# Patient Record
Sex: Female | Born: 1950 | Race: Black or African American | Hispanic: No | Marital: Married | Smoking: Never smoker
Health system: Southern US, Community
[De-identification: ages and names within clinical notes are randomized; demographics above are authoritative.]

## PROBLEM LIST (undated history)

## (undated) DIAGNOSIS — I10 Essential (primary) hypertension: Secondary | ICD-10-CM

## (undated) DIAGNOSIS — E119 Type 2 diabetes mellitus without complications: Secondary | ICD-10-CM

## (undated) HISTORY — PX: BACK SURGERY: SHX140

---

## 2005-02-13 ENCOUNTER — Ambulatory Visit: Payer: Self-pay | Admitting: Internal Medicine

## 2005-02-20 ENCOUNTER — Ambulatory Visit: Payer: Self-pay | Admitting: Internal Medicine

## 2005-04-25 ENCOUNTER — Ambulatory Visit: Payer: Self-pay | Admitting: Gastroenterology

## 2005-05-10 ENCOUNTER — Ambulatory Visit: Payer: Self-pay | Admitting: Gastroenterology

## 2007-04-28 ENCOUNTER — Ambulatory Visit: Payer: Self-pay | Admitting: Internal Medicine

## 2008-05-03 ENCOUNTER — Ambulatory Visit: Payer: Self-pay | Admitting: Internal Medicine

## 2008-09-20 ENCOUNTER — Ambulatory Visit: Payer: Self-pay | Admitting: Internal Medicine

## 2008-10-15 ENCOUNTER — Emergency Department: Payer: Self-pay | Admitting: Emergency Medicine

## 2010-03-15 ENCOUNTER — Ambulatory Visit: Payer: Self-pay | Admitting: Internal Medicine

## 2011-10-09 ENCOUNTER — Ambulatory Visit: Payer: Self-pay | Admitting: Internal Medicine

## 2013-03-10 ENCOUNTER — Emergency Department: Payer: Self-pay | Admitting: Emergency Medicine

## 2013-03-10 LAB — CBC
HCT: 40.8 % (ref 35.0–47.0)
HGB: 14.1 g/dL (ref 12.0–16.0)
MCH: 32 pg (ref 26.0–34.0)
MCHC: 34.5 g/dL (ref 32.0–36.0)
MCV: 93 fL (ref 80–100)
Platelet: 224 10*3/uL (ref 150–440)
RDW: 12.3 % (ref 11.5–14.5)

## 2013-03-10 LAB — BASIC METABOLIC PANEL
Anion Gap: 10 (ref 7–16)
Chloride: 101 mmol/L (ref 98–107)
Creatinine: 0.88 mg/dL (ref 0.60–1.30)
EGFR (Non-African Amer.): >60
Sodium: 135 mmol/L — ABNORMAL LOW (ref 136–145)

## 2013-04-08 ENCOUNTER — Ambulatory Visit: Payer: Self-pay | Admitting: Internal Medicine

## 2013-11-20 DIAGNOSIS — E785 Hyperlipidemia, unspecified: Secondary | ICD-10-CM | POA: Diagnosis present

## 2013-11-20 DIAGNOSIS — E1169 Type 2 diabetes mellitus with other specified complication: Secondary | ICD-10-CM | POA: Diagnosis present

## 2013-11-20 DIAGNOSIS — M810 Age-related osteoporosis without current pathological fracture: Secondary | ICD-10-CM | POA: Insufficient documentation

## 2015-09-27 ENCOUNTER — Encounter: Payer: Self-pay | Admitting: Emergency Medicine

## 2015-09-27 ENCOUNTER — Emergency Department
Admission: EM | Admit: 2015-09-27 | Discharge: 2015-09-27 | Disposition: A | Discharge status: Home / Self Care | Payer: BC Managed Care – PPO | Attending: Emergency Medicine | Admitting: Emergency Medicine

## 2015-09-27 ENCOUNTER — Emergency Department: Payer: BC Managed Care – PPO

## 2015-09-27 DIAGNOSIS — I10 Essential (primary) hypertension: Secondary | ICD-10-CM | POA: Diagnosis not present

## 2015-09-27 DIAGNOSIS — N390 Urinary tract infection, site not specified: Secondary | ICD-10-CM | POA: Insufficient documentation

## 2015-09-27 DIAGNOSIS — E119 Type 2 diabetes mellitus without complications: Secondary | ICD-10-CM | POA: Diagnosis not present

## 2015-09-27 DIAGNOSIS — R300 Dysuria: Secondary | ICD-10-CM | POA: Diagnosis present

## 2015-09-27 HISTORY — DX: Essential (primary) hypertension: I10

## 2015-09-27 HISTORY — DX: Type 2 diabetes mellitus without complications: E11.9

## 2015-09-27 LAB — URINALYSIS COMPLETE WITH MICROSCOPIC (ARMC ONLY)
BILIRUBIN URINE: NEGATIVE
NITRITE: NEGATIVE
Protein, ur: 30 mg/dL — AB
Specific Gravity, Urine: 1.023 (ref 1.005–1.030)
pH: 5 (ref 5.0–8.0)

## 2015-09-27 LAB — BASIC METABOLIC PANEL
ANION GAP: 11 (ref 5–15)
BUN: 8 mg/dL (ref 6–20)
CHLORIDE: 101 mmol/L (ref 101–111)
CO2: 24 mmol/L (ref 22–32)
Calcium: 9.6 mg/dL (ref 8.9–10.3)
Creatinine, Ser: 0.89 mg/dL (ref 0.44–1.00)
GFR calc Af Amer: >60 mL/min (ref >60)
GLUCOSE: 271 mg/dL — AB (ref 65–99)
POTASSIUM: 3.9 mmol/L (ref 3.5–5.1)
Sodium: 136 mmol/L (ref 135–145)

## 2015-09-27 LAB — CBC
HEMATOCRIT: 40.7 % (ref 35.0–47.0)
HEMOGLOBIN: 13.6 g/dL (ref 12.0–16.0)
MCH: 31.1 pg (ref 26.0–34.0)
MCHC: 33.5 g/dL (ref 32.0–36.0)
MCV: 92.9 fL (ref 80.0–100.0)
Platelets: 205 10*3/uL (ref 150–440)
RBC: 4.38 MIL/uL (ref 3.80–5.20)
RDW: 12.7 % (ref 11.5–14.5)
WBC: 11.8 10*3/uL — ABNORMAL HIGH (ref 3.6–11.0)

## 2015-09-27 LAB — GLUCOSE, CAPILLARY: GLUCOSE-CAPILLARY: 271 mg/dL — AB (ref 65–99)

## 2015-09-27 MED ORDER — ACETAMINOPHEN 325 MG PO TABS
650.0000 mg | ORAL_TABLET | Freq: Once | ORAL | Status: AC | PRN
  Administered 2015-09-27: 650 mg via ORAL
  Filled 2015-09-27: qty 2

## 2015-09-27 MED ORDER — SODIUM CHLORIDE 0.9 % IV BOLUS (SEPSIS)
1000.0000 mL | Freq: Once | INTRAVENOUS | Status: AC
  Administered 2015-09-27: 1000 mL via INTRAVENOUS

## 2015-09-27 MED ORDER — DEXTROSE 5 % IV SOLN: 1.0000 g | Freq: Once | INTRAVENOUS | Status: DC

## 2015-09-27 MED ORDER — DEXTROSE 5 % IV SOLN
1.0000 g | Freq: Once | INTRAVENOUS | Status: AC
  Administered 2015-09-27: 1 g via INTRAVENOUS
  Filled 2015-09-27: qty 10

## 2015-09-27 MED ORDER — CEPHALEXIN 500 MG PO CAPS: 500.0000 mg | ORAL_CAPSULE | Freq: Two times a day (BID) | ORAL | Status: DC

## 2015-09-27 NOTE — ED Notes (Signed)
Lab results reviewed. Patient observed in subwait in no acute distress.

## 2015-09-27 NOTE — ED Notes (Signed)
Patient presents to the ED from PCP.  Patient states her PCP instructed her to come to the ED because she was dehydrated and needs fluids.  Patient was also informed that she had a UTI.  Patient states she has diabetes and her doctor told her that her blood sugar was high.  Patient states she stopped taking her medication for her diabetes and checking her blood sugar about 1 year ago.

## 2015-09-27 NOTE — ED Provider Notes (Signed)
[facility] Emergency Department Provider Note   ____________________________________________  Time seen:  I have reviewed the triage vital signs and the triage nursing note.  HISTORY  Chief Complaint Fever; Fatigue; and Dysuria   Historian Patient  HPI Ashley Ellison is a 65 y.o. female is here for evaluation for possible dehydration. She states that she's felt bad for a few days and went to her primary care physician and was told she may be dehydrated and need fluids. Patient states that she thought she's had a fever. She's also had some mild vomiting and diarrhea. No significant pain. Mild dysuria, similar when she had a prior UTI diagnosed.  Denies chest pain or shortness of breath or cough.      Past Medical History  Diagnosis Date  . Diabetes mellitus without complication (HCC)   . Hypertension     There are no active problems to display for this patient.   Past Surgical History  Procedure Laterality Date  . Back surgery    . Vaginal delivery      Current Outpatient Rx  Name  Route  Sig  Dispense  Refill  . cephALEXin (KEFLEX) 500 MG capsule   Oral   Take 1 capsule (500 mg total) by mouth 2 (two) times daily.   14 capsule   0     Allergies Review of patient's allergies indicates no known allergies.  No family history on file.  Social History Social History  Substance Use Topics  . Smoking status: Never Smoker   . Smokeless tobacco: None  . Alcohol Use: No    Review of Systems  Constitutional: Positive for fever. Eyes: Negative for visual changes. ENT: Negative for sore throat. Cardiovascular: Negative for chest pain. Respiratory: Negative for shortness of breath. Gastrointestinal: Positive for mild vomiting and diarrhea. Genitourinary: Positive for dysuria. Musculoskeletal: Negative for back pain. Skin: Negative for rash. Neurological: Negative for headache. 10 point Review of Systems otherwise  negative ____________________________________________   PHYSICAL EXAM:  VITAL SIGNS: ED Triage Vitals  Enc Vitals Group     BP 09/27/15 1638 177/71 mmHg     Pulse Rate 09/27/15 1638 106     Resp 09/27/15 1638 20     Temp 09/27/15 1638 101.6 F (38.7 C)     Temp Source 09/27/15 1638 Oral     SpO2 09/27/15 1638 100 %     Weight 09/27/15 1638 180 lb (81.647 kg)     Height 09/27/15 1638  (1.702 m)     Head Cir --      Peak Flow --      Pain Score 09/27/15 1639 7     Pain Loc --      Pain Edu? --      Excl. in GC? --      Constitutional: Alert and oriented. Well appearing and in no distress. HEENT   Head: Normocephalic and atraumatic.      Eyes: Conjunctivae are normal. PERRL. Normal extraocular movements.      Ears:         Nose: No congestion/rhinnorhea.   Mouth/Throat: Mucous membranes are moist.   Neck: No stridor. Cardiovascular/Chest: Normal rate, regular rhythm.  No murmurs, rubs, or gallops. Respiratory: Normal respiratory effort without tachypnea nor retractions. Breath sounds are clear and equal bilaterally. No wheezes/rales/rhonchi. Gastrointestinal: Soft. No distention, no guarding, no rebound. Nontender.    Genitourinary/rectal:Deferred Musculoskeletal: Nontender with normal range of motion in all extremities. No joint effusions.  No lower extremity  tenderness.  No edema. Neurologic:  Normal speech and language. No gross or focal neurologic deficits are appreciated. Skin:  Skin is warm, dry and intact. No rash noted. Psychiatric: Mood and affect are normal. Speech and behavior are normal. Patient exhibits appropriate insight and judgment.  ____________________________________________   EKG I, Governor Rooksebecca Mekisha Bittel, MD, the attending physician have personally viewed and interpreted all ECGs.  104 sinus tachycardia. Narrow QRS. Normal axis. Normal ST and T-wave ____________________________________________  LABS (pertinent positives/negatives)  Labs  Reviewed  BASIC METABOLIC PANEL - Abnormal; Notable for the following:    Glucose, Bld 271 (*)    All other components within normal limits  CBC - Abnormal; Notable for the following:    WBC 11.8 (*)    All other components within normal limits  URINALYSIS COMPLETEWITH MICROSCOPIC ([facility] ONLY) - Abnormal; Notable for the following:    Color, Urine YELLOW (*)    APPearance CLEAR (*)    Glucose, UA >500 (*)    Ketones, ur TRACE (*)    Hgb urine dipstick 2+ (*)    Protein, ur 30 (*)    Leukocytes, UA 1+ (*)    Bacteria, UA MANY (*)    Squamous Epithelial / LPF 0-5 (*)    All other components within normal limits  GLUCOSE, CAPILLARY - Abnormal; Notable for the following:    Glucose-Capillary 271 (*)    All other components within normal limits  URINE CULTURE  CULTURE, BLOOD (ROUTINE X 2)  CULTURE, BLOOD (ROUTINE X 2)  CBG MONITORING, ED    ____________________________________________  RADIOLOGY All Xrays were viewed by me. Imaging interpreted by Radiologist.  Chest and abdomen x-ray:Negative for acute disease. __________________________________________  PROCEDURES  Procedure(s) performed: None  Critical Care performed: None  ____________________________________________   ED COURSE / ASSESSMENT AND PLAN  Pertinent labs & imaging results that were available during my care of the patient were reviewed by me and considered in my medical decision making (see chart for details).   The patient's overall well-appearing, but she did present with fever and slight tachycardia. She's had no hypotension. She reported symptoms may be similar to prior UTI and indeed she does have evidence of a urinary tract infection. Her white blood cell count is slightly elevated, but she is overall well-appearing and after a little bit of fluids she feels well enough to go home and I think this is reasonable. I don't think she showing any signs of sepsis. I will go ahead and send urine culture and  I sent blood cultures as well.  Patient was given a dose of Rocephin here and will be discharged with Keflex.    CONSULTATIONS:   none   Patient / Family / Caregiver informed of clinical course, medical decision-making process, and agree with plan.   I discussed return precautions, follow-up instructions, and discharged instructions with patient and/or family.   ___________________________________________   FINAL CLINICAL IMPRESSION(S) / ED DIAGNOSES   Final diagnoses:  UTI (lower urinary tract infection)              Note: This dictation was prepared with Dragon dictation. Any transcriptional errors that result from this process are unintentional   Governor Rooksebecca Terril Chestnut, MD 09/27/15 2046

## 2015-09-27 NOTE — Discharge Instructions (Signed)
You were evaluated in the emergency department and found to have a urinary tract infection. Return to emergency department for any new or worsening condition including abdominal pain, vomiting and cannot keep liquids down, dizziness, altered mental status, or any other symptoms concerning to you.   Urinary Tract Infection Urinary tract infections (UTIs) can develop anywhere along your urinary tract. Your urinary tract is your body's drainage system for removing wastes and extra water. Your urinary tract includes two kidneys, two ureters, a bladder, and a urethra. Your kidneys are a pair of bean-shaped organs. Each kidney is about the size of your fist. They are located below your ribs, one on each side of your spine. CAUSES Infections are caused by microbes, which are microscopic organisms, including fungi, viruses, and bacteria. These organisms are so small that they can only be seen through a microscope. Bacteria are the microbes that most commonly cause UTIs. SYMPTOMS  Symptoms of UTIs may vary by age and gender of the patient and by the location of the infection. Symptoms in young women typically include a frequent and intense urge to urinate and a painful, burning feeling in the bladder or urethra during urination. Older women and men are more likely to be tired, shaky, and weak and have muscle aches and abdominal pain. A fever may mean the infection is in your kidneys. Other symptoms of a kidney infection include pain in your back or sides below the ribs, nausea, and vomiting. DIAGNOSIS To diagnose a UTI, your caregiver will ask you about your symptoms. Your caregiver will also ask you to provide a urine sample. The urine sample will be tested for bacteria and white blood cells. White blood cells are made by your body to help fight infection. TREATMENT  Typically, UTIs can be treated with medication. Because most UTIs are caused by a bacterial infection, they usually can be treated with the use of  antibiotics. The choice of antibiotic and length of treatment depend on your symptoms and the type of bacteria causing your infection. HOME CARE INSTRUCTIONS  If you were prescribed antibiotics, take them exactly as your caregiver instructs you. Finish the medication even if you feel better after you have only taken some of the medication.  Drink enough water and fluids to keep your urine clear or pale yellow.  Avoid caffeine, tea, and carbonated beverages. They tend to irritate your bladder.  Empty your bladder often. Avoid holding urine for long periods of time.  Empty your bladder before and after sexual intercourse.  After a bowel movement, women should cleanse from front to back. Use each tissue only once. SEEK MEDICAL CARE IF:   You have back pain.  You develop a fever.  Your symptoms do not begin to resolve within 3 days. SEEK IMMEDIATE MEDICAL CARE IF:   You have severe back pain or lower abdominal pain.  You develop chills.  You have nausea or vomiting.  You have continued burning or discomfort with urination. MAKE SURE YOU:   Understand these instructions.  Will watch your condition.  Will get help right away if you are not doing well or get worse.   This information is not intended to replace advice given to you by your health care provider. Make sure you discuss any questions you have with your health care provider.   Document Released: 01/30/2005 Document Revised: 01/11/2015 Document Reviewed: 05/31/2011 Elsevier Interactive Patient Education [employer]2016 [employer] Inc.

## 2015-09-28 ENCOUNTER — Telehealth: Payer: Self-pay | Admitting: Emergency Medicine

## 2015-09-28 LAB — BLOOD CULTURE ID PANEL (REFLEXED)
Acinetobacter baumannii: NOT DETECTED
CANDIDA ALBICANS: NOT DETECTED
CANDIDA GLABRATA: NOT DETECTED
CANDIDA KRUSEI: NOT DETECTED
CANDIDA TROPICALIS: NOT DETECTED
Candida parapsilosis: NOT DETECTED
Carbapenem resistance: NOT DETECTED
ENTEROBACTER CLOACAE COMPLEX: NOT DETECTED
ESCHERICHIA COLI: NOT DETECTED
Enterobacteriaceae species: DETECTED — AB
Enterococcus species: NOT DETECTED
HAEMOPHILUS INFLUENZAE: NOT DETECTED
KLEBSIELLA OXYTOCA: NOT DETECTED
Klebsiella pneumoniae: DETECTED — AB
Listeria monocytogenes: NOT DETECTED
METHICILLIN RESISTANCE: NOT DETECTED
NEISSERIA MENINGITIDIS: NOT DETECTED
PROTEUS SPECIES: NOT DETECTED
Pseudomonas aeruginosa: NOT DETECTED
STREPTOCOCCUS PNEUMONIAE: NOT DETECTED
STREPTOCOCCUS PYOGENES: NOT DETECTED
STREPTOCOCCUS SPECIES: NOT DETECTED
Serratia marcescens: NOT DETECTED
Staphylococcus aureus (BCID): NOT DETECTED
Staphylococcus species: NOT DETECTED
Streptococcus agalactiae: NOT DETECTED
Vancomycin resistance: NOT DETECTED

## 2015-09-28 NOTE — Progress Notes (Signed)
PHARMACY - PHYSICIAN COMMUNICATION CRITICAL VALUE ALERT - BLOOD CULTURE IDENTIFICATION (BCID)  Results for orders placed or performed during the hospital encounter of 09/27/15  Blood Culture ID Panel (Reflexed) (Collected: 09/27/2015  7:00 PM)  Result Value Ref Range   Enterococcus species NOT DETECTED NOT DETECTED   Vancomycin resistance NOT DETECTED NOT DETECTED   Listeria monocytogenes NOT DETECTED NOT DETECTED   Staphylococcus species NOT DETECTED NOT DETECTED   Staphylococcus aureus NOT DETECTED NOT DETECTED   Methicillin resistance NOT DETECTED NOT DETECTED   Streptococcus species NOT DETECTED NOT DETECTED   Streptococcus agalactiae NOT DETECTED NOT DETECTED   Streptococcus pneumoniae NOT DETECTED NOT DETECTED   Streptococcus pyogenes NOT DETECTED NOT DETECTED   Acinetobacter baumannii NOT DETECTED NOT DETECTED   Enterobacteriaceae species DETECTED (A) NOT DETECTED   Enterobacter cloacae complex NOT DETECTED NOT DETECTED   Escherichia coli NOT DETECTED NOT DETECTED   Klebsiella oxytoca NOT DETECTED NOT DETECTED   Klebsiella pneumoniae DETECTED (A) NOT DETECTED   Proteus species NOT DETECTED NOT DETECTED   Serratia marcescens NOT DETECTED NOT DETECTED   Carbapenem resistance NOT DETECTED NOT DETECTED   Haemophilus influenzae NOT DETECTED NOT DETECTED   Neisseria meningitidis NOT DETECTED NOT DETECTED   Pseudomonas aeruginosa NOT DETECTED NOT DETECTED   Candida albicans NOT DETECTED NOT DETECTED   Candida glabrata NOT DETECTED NOT DETECTED   Candida krusei NOT DETECTED NOT DETECTED   Candida parapsilosis NOT DETECTED NOT DETECTED   Candida tropicalis NOT DETECTED NOT DETECTED    Name of physician (or Provider) Contacted: Ashley Ellison  Changes to prescribed antibiotics required: Per Verbal Order from MD Derrill Ellison, contact patient to have them present to the ED.  Called phone number on file at [phone] on 5/25 and no answer with no voicemail set up.  Will continue to try to reach  patient.   Ashley Ellison 09/28/2015  2:10 PM

## 2015-09-28 NOTE — ED Notes (Signed)
Attempting to contact patient to ask her to return for re evaluation due to positive blood culture.  Patient's number-no answer and no voicemail available.  Called and left messages on both emergency contacts:   Shaunna and Stephanie's voicemails and asked them to call me or the charge nurse.

## 2015-09-30 LAB — URINE CULTURE: Culture: >=100000 — AB

## 2015-10-02 LAB — CULTURE, BLOOD (ROUTINE X 2): CULTURE: NO GROWTH

## 2018-03-01 DIAGNOSIS — E1142 Type 2 diabetes mellitus with diabetic polyneuropathy: Secondary | ICD-10-CM | POA: Insufficient documentation

## 2019-03-18 ENCOUNTER — Other Ambulatory Visit: Payer: Self-pay

## 2019-03-18 ENCOUNTER — Emergency Department: Payer: Medicare Other

## 2019-03-18 ENCOUNTER — Encounter: Payer: Self-pay | Admitting: Emergency Medicine

## 2019-03-18 ENCOUNTER — Emergency Department
Admission: EM | Admit: 2019-03-18 | Discharge: 2019-03-18 | Disposition: A | Discharge status: Home / Self Care | Payer: Medicare Other | Source: Home / Self Care | Attending: Emergency Medicine | Admitting: Emergency Medicine

## 2019-03-18 DIAGNOSIS — N390 Urinary tract infection, site not specified: Secondary | ICD-10-CM | POA: Diagnosis not present

## 2019-03-18 DIAGNOSIS — R7881 Bacteremia: Secondary | ICD-10-CM | POA: Diagnosis not present

## 2019-03-18 DIAGNOSIS — N3000 Acute cystitis without hematuria: Secondary | ICD-10-CM

## 2019-03-18 DIAGNOSIS — Z20828 Contact with and (suspected) exposure to other viral communicable diseases: Secondary | ICD-10-CM | POA: Insufficient documentation

## 2019-03-18 DIAGNOSIS — E119 Type 2 diabetes mellitus without complications: Secondary | ICD-10-CM | POA: Insufficient documentation

## 2019-03-18 DIAGNOSIS — I10 Essential (primary) hypertension: Secondary | ICD-10-CM | POA: Insufficient documentation

## 2019-03-18 DIAGNOSIS — R0600 Dyspnea, unspecified: Secondary | ICD-10-CM

## 2019-03-18 LAB — CBC WITH DIFFERENTIAL/PLATELET
Abs Immature Granulocytes: 0.03 10*3/uL (ref 0.00–0.07)
Basophils Absolute: 0 10*3/uL (ref 0.0–0.1)
Basophils Relative: 0 %
Eosinophils Absolute: 0.2 10*3/uL (ref 0.0–0.5)
Eosinophils Relative: 2 %
HCT: 37.3 % (ref 36.0–46.0)
Hemoglobin: 13 g/dL (ref 12.0–15.0)
Immature Granulocytes: 0 %
Lymphocytes Relative: 14 %
Lymphs Abs: 1.1 10*3/uL (ref 0.7–4.0)
MCH: 30.6 pg (ref 26.0–34.0)
MCHC: 34.9 g/dL (ref 30.0–36.0)
MCV: 87.8 fL (ref 80.0–100.0)
Monocytes Absolute: 0.5 10*3/uL (ref 0.1–1.0)
Monocytes Relative: 7 %
Neutro Abs: 6.4 10*3/uL (ref 1.7–7.7)
Neutrophils Relative %: 77 %
Platelets: 222 10*3/uL (ref 150–400)
RBC: 4.25 MIL/uL (ref 3.87–5.11)
RDW: 11.5 % (ref 11.5–15.5)
WBC: 8.3 10*3/uL (ref 4.0–10.5)
nRBC: 0 % (ref 0.0–0.2)

## 2019-03-18 LAB — LACTIC ACID, PLASMA
Lactic Acid, Venous: 2.1 mmol/L (ref 0.5–1.9)
Lactic Acid, Venous: 1.8 mmol/L (ref 0.5–1.9)

## 2019-03-18 LAB — URINALYSIS, COMPLETE (UACMP) WITH MICROSCOPIC
Bilirubin Urine: NEGATIVE
Glucose, UA: >=500 mg/dL — AB
Ketones, ur: NEGATIVE mg/dL
Nitrite: POSITIVE — AB
Protein, ur: 30 mg/dL — AB
Specific Gravity, Urine: 1.021 (ref 1.005–1.030)
pH: 7 (ref 5.0–8.0)

## 2019-03-18 LAB — COMPREHENSIVE METABOLIC PANEL
ALT: 15 U/L (ref 0–44)
AST: 20 U/L (ref 15–41)
Albumin: 3.8 g/dL (ref 3.5–5.0)
Alkaline Phosphatase: 50 U/L (ref 38–126)
Anion gap: 12 (ref 5–15)
BUN: 12 mg/dL (ref 8–23)
CO2: 22 mmol/L (ref 22–32)
Calcium: 9.8 mg/dL (ref 8.9–10.3)
Chloride: 103 mmol/L (ref 98–111)
Creatinine, Ser: 0.75 mg/dL (ref 0.44–1.00)
GFR calc Af Amer: >60 mL/min (ref >60)
GFR calc non Af Amer: >60 mL/min (ref >60)
Glucose, Bld: 221 mg/dL — ABNORMAL HIGH (ref 70–99)
Potassium: 3.7 mmol/L (ref 3.5–5.1)
Sodium: 137 mmol/L (ref 135–145)
Total Bilirubin: 0.8 mg/dL (ref 0.3–1.2)
Total Protein: 8.1 g/dL (ref 6.5–8.1)

## 2019-03-18 LAB — GLUCOSE, CAPILLARY: Glucose-Capillary: 215 mg/dL — ABNORMAL HIGH (ref 70–99)

## 2019-03-18 LAB — PROTIME-INR
INR: 1 (ref 0.8–1.2)
Prothrombin Time: 13.4 seconds (ref 11.4–15.2)

## 2019-03-18 MED ORDER — ONDANSETRON HCL 4 MG/2ML IJ SOLN
4.0000 mg | Freq: Once | INTRAMUSCULAR | Status: AC
  Administered 2019-03-18: 4 mg via INTRAVENOUS
  Filled 2019-03-18: qty 2

## 2019-03-18 MED ORDER — IOHEXOL 350 MG/ML SOLN
75.0000 mL | Freq: Once | INTRAVENOUS | Status: AC | PRN
  Administered 2019-03-18: 75 mL via INTRAVENOUS

## 2019-03-18 MED ORDER — SODIUM CHLORIDE 0.9 % IV SOLN
1.0000 g | Freq: Once | INTRAVENOUS | Status: AC
  Administered 2019-03-18: 1 g via INTRAVENOUS
  Filled 2019-03-18: qty 10

## 2019-03-18 MED ORDER — KETOROLAC TROMETHAMINE 30 MG/ML IJ SOLN
15.0000 mg | Freq: Once | INTRAMUSCULAR | Status: AC
  Administered 2019-03-18: 15 mg via INTRAVENOUS
  Filled 2019-03-18: qty 1

## 2019-03-18 MED ORDER — ACETAMINOPHEN 325 MG PO TABS
650.0000 mg | ORAL_TABLET | Freq: Once | ORAL | Status: AC
  Administered 2019-03-18: 650 mg via ORAL
  Filled 2019-03-18: qty 2

## 2019-03-18 MED ORDER — SODIUM CHLORIDE 0.9 % IV SOLN
Freq: Once | INTRAVENOUS | Status: AC
  Administered 2019-03-18: 19:00:00 via INTRAVENOUS

## 2019-03-18 MED ORDER — CEPHALEXIN 500 MG PO CAPS: 500.0000 mg | ORAL_CAPSULE | Freq: Four times a day (QID) | ORAL | 0 refills | Status: DC

## 2019-03-18 NOTE — ED Provider Notes (Addendum)
[facility] Emergency Department Provider Note       Time seen: ----------------------------------------- 6:09 PM on 03/18/2019 -----------------------------------------   I have reviewed the triage vital signs and the nursing notes.  HISTORY   Chief Complaint Shortness of Breath    HPI Ashley Ellison is a 68 y.o. female with a history of diabetes, hypertension who presents to the ED for shortness of breath.  Patient denies knowing she had a fever, chills, chest pain, cough, vomiting or diarrhea.  She does have some nausea.  She denies any Covid exposure.  Patient states she felt like her oxygen levels were low.  Past Medical History:  Diagnosis Date  . Diabetes mellitus without complication (HCC)   . Hypertension     There are no active problems to display for this patient.   Past Surgical History:  Procedure Laterality Date  . BACK SURGERY    . VAGINAL DELIVERY      Allergies Patient has no known allergies.  Social History Social History   Tobacco Use  . Smoking status: Never Smoker  Substance Use Topics  . Alcohol use: No  . Drug use: Not on file    Review of Systems Constitutional: Positive for fever here Cardiovascular: Negative for chest pain. Respiratory: Positive for shortness of breath Gastrointestinal: Negative for abdominal pain, vomiting and diarrhea. Musculoskeletal: Negative for back pain. Skin: Negative for rash. Neurological: Negative for headaches, focal weakness or numbness.  All systems negative/normal/unremarkable except as stated in the HPI  ____________________________________________   PHYSICAL EXAM:  VITAL SIGNS: ED Triage Vitals  Enc Vitals Group     BP 03/18/19 1731 (!) 186/62     Pulse Rate 03/18/19 1731 96     Resp 03/18/19 1731 18     Temp 03/18/19 1731 (!) 102.3 F (39.1 C)     Temp Source 03/18/19 1731 Oral     SpO2 03/18/19 1731 100 %     Weight 03/18/19 1732 180 lb (81.6 kg)   Height --      Head Circumference --      Peak Flow --      Pain Score 03/18/19 1723 0     Pain Loc --      Pain Edu? --      Excl. in GC? --    Constitutional: Alert and oriented. Well appearing and in no distress. Eyes: Conjunctivae are normal. Normal extraocular movements. ENT      Head: Normocephalic and atraumatic.      Nose: No congestion/rhinnorhea.      Mouth/Throat: Mucous membranes are moist.      Neck: No stridor. Cardiovascular: Normal rate, regular rhythm. No murmurs, rubs, or gallops. Respiratory: Normal respiratory effort without tachypnea nor retractions. Breath sounds are clear and equal bilaterally. No wheezes/rales/rhonchi. Gastrointestinal: Soft and nontender. Normal bowel sounds Musculoskeletal: Nontender with normal range of motion in extremities. No lower extremity tenderness nor edema. Neurologic:  Normal speech and language. No gross focal neurologic deficits are appreciated.  Skin:  Skin is warm, dry and intact. No rash noted. Psychiatric: Mood and affect are normal. Speech and behavior are normal.  ____________________________________________  EKG: Interpreted by me.  Sinus rhythm with a rate of 98 bpm, normal PR interval, normal QRS, nonspecific ST segment changes, normal QT  ____________________________________________  ED COURSE:  As part of my medical decision making, I reviewed the following data within the electronic MEDICAL RECORD NUMBER History obtained from family if available, nursing notes, old chart and ekg,  as well as notes from prior ED visits. Patient presented for shortness of breath, we will assess with labs and imaging as indicated at this time.   Procedures  Ashley Ellison was evaluated in Emergency Department on 03/18/2019 for the symptoms described in the history of present illness. She was evaluated in the context of the global COVID-19 pandemic, which necessitated consideration that the patient might be at risk for infection with  the SARS-CoV-2 virus that causes COVID-19. Institutional protocols and algorithms that pertain to the evaluation of patients at risk for COVID-19 are in a state of rapid change based on information released by regulatory bodies including the CDC and federal and state organizations. These policies and algorithms were followed during the patient's care in the ED.  ____________________________________________   LABS (pertinent positives/negatives)  Labs Reviewed  COMPREHENSIVE METABOLIC PANEL - Abnormal; Notable for the following components:      Result Value   Glucose, Bld 221 (*)    All other components within normal limits  LACTIC ACID, PLASMA - Abnormal; Notable for the following components:   Lactic Acid, Venous 2.1 (*)    All other components within normal limits  URINALYSIS, COMPLETE (UACMP) WITH MICROSCOPIC - Abnormal; Notable for the following components:   Color, Urine YELLOW (*)    APPearance HAZY (*)    Glucose, UA >=500 (*)    Hgb urine dipstick SMALL (*)    Protein, ur 30 (*)    Nitrite POSITIVE (*)    Leukocytes,Ua SMALL (*)    Bacteria, UA RARE (*)    All other components within normal limits  GLUCOSE, CAPILLARY - Abnormal; Notable for the following components:   Glucose-Capillary 215 (*)    All other components within normal limits  CULTURE, BLOOD (ROUTINE X 2)  CULTURE, BLOOD (ROUTINE X 2)  SARS CORONAVIRUS 2 (TAT 6-24 HRS)  LACTIC ACID, PLASMA  CBC WITH DIFFERENTIAL/PLATELET  PROTIME-INR    RADIOLOGY Images were viewed by me  Chest x-ray IMPRESSION:  1. No pulmonary embolus or acute intrathoracic abnormality. No  evidence of pneumonia.  2. Coronary artery calcifications.  3. Tiny bilateral subpleural and perifissural nodules, largest 5 mm  in the left lower lobe. Suspect these are intrapulmonary lymph  nodes, however recommend follow-up if patient is high-risk. No  follow-up needed if patient is low-risk (and has no known or  suspected primary neoplasm).  Non-contrast chest CT can be considered  in 12 months if patient is high-risk. This recommendation follows  the consensus statement: Guidelines for Management of Incidental  Pulmonary Nodules Detected on CT Images: From the Fleischner Society  2017; Radiology 2017; 284:228-243.   Aortic Atherosclerosis (ICD10-I70.0).  ____________________________________________   DIFFERENTIAL DIAGNOSIS   COVID-19, pneumonia, PE, pneumothorax, MI  FINAL ASSESSMENT AND PLAN  Dyspnea, uti   Plan: The patient had presented for dyspnea and was found to be febrile. Patient's labs were grossly unremarkable, lactic acid level improved from 2.1-1.8.  There were no other signs of sepsis.  She did have evidence of urinary tract infection and we sent a urine culture.  She received IV Rocephin 1 g due to her previous history.  Patient's imaging did not reveal any acute process.  No clear etiology for her shortness of breath.  Patient states she feels fine and wants to go home.  Have advised returning for worsening or worrisome symptoms.   Ulice DashJohnathan E Kailin Principato, MD    Note: This note was generated in part or whole with voice recognition software. Voice  recognition is usually quite accurate but there are transcription errors that can and very often do occur. I apologize for any typographical errors that were not detected and corrected.     Earleen Newport, MD 03/18/19 2036    Earleen Newport, MD 03/18/19 2036

## 2019-03-18 NOTE — ED Notes (Signed)
Second set blood cx by dorian NT right [facility]

## 2019-03-18 NOTE — ED Triage Notes (Addendum)
Here for [facility].  Pt denies any pain or other symptoms. No cough. Only c/o trouble getting breath. Denies covid exposure. Hx klebsiella sepsis.  Febrile today.  Has not been checking blood sugars.  Unlabored but pt does not appear well.

## 2019-03-19 ENCOUNTER — Encounter: Payer: Self-pay | Admitting: Emergency Medicine

## 2019-03-19 ENCOUNTER — Inpatient Hospital Stay
Admission: EM | Admit: 2019-03-19 | Discharge: 2019-03-21 | DRG: 690 | Disposition: A | Discharge status: Home / Self Care | Payer: Medicare Other | Attending: Internal Medicine | Admitting: Internal Medicine

## 2019-03-19 ENCOUNTER — Other Ambulatory Visit: Payer: Self-pay

## 2019-03-19 ENCOUNTER — Telehealth: Payer: Self-pay | Admitting: Emergency Medicine

## 2019-03-19 DIAGNOSIS — Z9119 Patient's noncompliance with other medical treatment and regimen: Secondary | ICD-10-CM

## 2019-03-19 DIAGNOSIS — Z91128 Patient's intentional underdosing of medication regimen for other reason: Secondary | ICD-10-CM

## 2019-03-19 DIAGNOSIS — N390 Urinary tract infection, site not specified: Principal | ICD-10-CM | POA: Diagnosis present

## 2019-03-19 DIAGNOSIS — T464X6A Underdosing of angiotensin-converting-enzyme inhibitors, initial encounter: Secondary | ICD-10-CM | POA: Diagnosis present

## 2019-03-19 DIAGNOSIS — Y92009 Unspecified place in unspecified non-institutional (private) residence as the place of occurrence of the external cause: Secondary | ICD-10-CM

## 2019-03-19 DIAGNOSIS — E1169 Type 2 diabetes mellitus with other specified complication: Secondary | ICD-10-CM | POA: Diagnosis present

## 2019-03-19 DIAGNOSIS — I152 Hypertension secondary to endocrine disorders: Secondary | ICD-10-CM | POA: Diagnosis present

## 2019-03-19 DIAGNOSIS — Z9114 Patient's other noncompliance with medication regimen: Secondary | ICD-10-CM

## 2019-03-19 DIAGNOSIS — E1159 Type 2 diabetes mellitus with other circulatory complications: Secondary | ICD-10-CM | POA: Insufficient documentation

## 2019-03-19 DIAGNOSIS — T383X6A Underdosing of insulin and oral hypoglycemic [antidiabetic] drugs, initial encounter: Secondary | ICD-10-CM | POA: Diagnosis present

## 2019-03-19 DIAGNOSIS — I10 Essential (primary) hypertension: Secondary | ICD-10-CM | POA: Diagnosis not present

## 2019-03-19 DIAGNOSIS — B9620 Unspecified Escherichia coli [E. coli] as the cause of diseases classified elsewhere: Secondary | ICD-10-CM | POA: Diagnosis present

## 2019-03-19 DIAGNOSIS — R7881 Bacteremia: Secondary | ICD-10-CM | POA: Diagnosis present

## 2019-03-19 DIAGNOSIS — Z79899 Other long term (current) drug therapy: Secondary | ICD-10-CM

## 2019-03-19 DIAGNOSIS — E1165 Type 2 diabetes mellitus with hyperglycemia: Secondary | ICD-10-CM | POA: Diagnosis present

## 2019-03-19 DIAGNOSIS — Z20828 Contact with and (suspected) exposure to other viral communicable diseases: Secondary | ICD-10-CM | POA: Diagnosis present

## 2019-03-19 DIAGNOSIS — E119 Type 2 diabetes mellitus without complications: Secondary | ICD-10-CM | POA: Diagnosis not present

## 2019-03-19 LAB — BLOOD CULTURE ID PANEL (REFLEXED)

## 2019-03-19 LAB — CBC WITH DIFFERENTIAL/PLATELET
Abs Immature Granulocytes: 0.04 10*3/uL (ref 0.00–0.07)
Basophils Absolute: 0 10*3/uL (ref 0.0–0.1)
Basophils Relative: 0 %
Eosinophils Absolute: 0 10*3/uL (ref 0.0–0.5)
Eosinophils Relative: 0 %
HCT: 35.7 % — ABNORMAL LOW (ref 36.0–46.0)
Hemoglobin: 12.5 g/dL (ref 12.0–15.0)
Immature Granulocytes: 0 %
Lymphocytes Relative: 15 %
Lymphs Abs: 1.8 10*3/uL (ref 0.7–4.0)
MCH: 30.9 pg (ref 26.0–34.0)
MCHC: 35 g/dL (ref 30.0–36.0)
MCV: 88.4 fL (ref 80.0–100.0)
Monocytes Absolute: 1.1 10*3/uL — ABNORMAL HIGH (ref 0.1–1.0)
Monocytes Relative: 10 %
Neutro Abs: 8.6 10*3/uL — ABNORMAL HIGH (ref 1.7–7.7)
Neutrophils Relative %: 75 %
Platelets: 196 10*3/uL (ref 150–400)
RBC: 4.04 MIL/uL (ref 3.87–5.11)
RDW: 11.4 % — ABNORMAL LOW (ref 11.5–15.5)
WBC: 11.7 10*3/uL — ABNORMAL HIGH (ref 4.0–10.5)
nRBC: 0 % (ref 0.0–0.2)

## 2019-03-19 LAB — COMPREHENSIVE METABOLIC PANEL
ALT: 14 U/L (ref 0–44)
AST: 16 U/L (ref 15–41)
Albumin: 3.6 g/dL (ref 3.5–5.0)
Alkaline Phosphatase: 44 U/L (ref 38–126)
Anion gap: 10 (ref 5–15)
BUN: 10 mg/dL (ref 8–23)
CO2: 24 mmol/L (ref 22–32)
Calcium: 9.2 mg/dL (ref 8.9–10.3)
Chloride: 99 mmol/L (ref 98–111)
Creatinine, Ser: 0.84 mg/dL (ref 0.44–1.00)
GFR calc Af Amer: >60 mL/min (ref >60)
GFR calc non Af Amer: >60 mL/min (ref >60)
Glucose, Bld: 234 mg/dL — ABNORMAL HIGH (ref 70–99)
Potassium: 3.7 mmol/L (ref 3.5–5.1)
Sodium: 133 mmol/L — ABNORMAL LOW (ref 135–145)
Total Bilirubin: 1 mg/dL (ref 0.3–1.2)
Total Protein: 8.1 g/dL (ref 6.5–8.1)

## 2019-03-19 LAB — SARS CORONAVIRUS 2 (TAT 6-24 HRS): SARS Coronavirus 2: NEGATIVE

## 2019-03-19 LAB — LACTIC ACID, PLASMA: Lactic Acid, Venous: 1.9 mmol/L (ref 0.5–1.9)

## 2019-03-19 MED ORDER — SODIUM CHLORIDE 0.9 % IV SOLN
INTRAVENOUS | Status: AC
  Administered 2019-03-20 (×2): via INTRAVENOUS

## 2019-03-19 MED ORDER — ACETAMINOPHEN 650 MG RE SUPP: 650.0000 mg | Freq: Four times a day (QID) | RECTAL | Status: DC | PRN

## 2019-03-19 MED ORDER — ACETAMINOPHEN 325 MG PO TABS
650.0000 mg | ORAL_TABLET | Freq: Four times a day (QID) | ORAL | Status: DC | PRN
  Administered 2019-03-20: 12:00:00 650 mg via ORAL
  Filled 2019-03-19: qty 2

## 2019-03-19 MED ORDER — INSULIN ASPART 100 UNIT/ML ~~LOC~~ SOLN
0.0000 [IU] | Freq: Every day | SUBCUTANEOUS | Status: DC
  Administered 2019-03-20: 2 [IU] via SUBCUTANEOUS
  Filled 2019-03-19: qty 1

## 2019-03-19 MED ORDER — SODIUM CHLORIDE 0.9 % IV SOLN
2.0000 g | INTRAVENOUS | Status: DC
  Administered 2019-03-20 – 2019-03-21 (×2): 2 g via INTRAVENOUS
  Filled 2019-03-19 (×4): qty 20

## 2019-03-19 MED ORDER — SODIUM CHLORIDE 0.9 % IV BOLUS
1000.0000 mL | Freq: Once | INTRAVENOUS | Status: AC
  Administered 2019-03-19: 1000 mL via INTRAVENOUS

## 2019-03-19 MED ORDER — ENOXAPARIN SODIUM 40 MG/0.4ML ~~LOC~~ SOLN
40.0000 mg | SUBCUTANEOUS | Status: DC
  Administered 2019-03-20 – 2019-03-21 (×2): 40 mg via SUBCUTANEOUS
  Filled 2019-03-19 (×3): qty 0.4

## 2019-03-19 MED ORDER — ACETAMINOPHEN 500 MG PO TABS
1000.0000 mg | ORAL_TABLET | Freq: Once | ORAL | Status: AC
  Administered 2019-03-19: 16:00:00 1000 mg via ORAL

## 2019-03-19 MED ORDER — ACETAMINOPHEN 500 MG PO TABS
ORAL_TABLET | ORAL | Status: AC
  Administered 2019-03-19: 1000 mg via ORAL
  Filled 2019-03-19: qty 2

## 2019-03-19 MED ORDER — INSULIN ASPART 100 UNIT/ML ~~LOC~~ SOLN
0.0000 [IU] | Freq: Three times a day (TID) | SUBCUTANEOUS | Status: DC
  Administered 2019-03-20 – 2019-03-21 (×3): 2 [IU] via SUBCUTANEOUS
  Administered 2019-03-21: 13:00:00 1 [IU] via SUBCUTANEOUS
  Filled 2019-03-19 (×4): qty 1

## 2019-03-19 MED ORDER — ONDANSETRON HCL 4 MG/2ML IJ SOLN: 4.0000 mg | Freq: Four times a day (QID) | INTRAMUSCULAR | Status: DC | PRN

## 2019-03-19 MED ORDER — ONDANSETRON HCL 4 MG PO TABS
4.0000 mg | ORAL_TABLET | Freq: Four times a day (QID) | ORAL | Status: DC | PRN
  Filled 2019-03-19: qty 1

## 2019-03-19 MED ORDER — SODIUM CHLORIDE 0.9 % IV SOLN
1.0000 g | Freq: Three times a day (TID) | INTRAVENOUS | Status: DC
  Administered 2019-03-19: 22:00:00 1 g via INTRAVENOUS
  Filled 2019-03-19 (×3): qty 1

## 2019-03-19 NOTE — ED Notes (Signed)
Pharmacy notified regarding continued wait for merepenem consult. Per beth with pharmacy will let pharmacist know.

## 2019-03-19 NOTE — ED Notes (Signed)
Pt denies any n/v, a&o x4, MD at bedside

## 2019-03-19 NOTE — ED Notes (Signed)
Pt provided with meal tray and drink, permission from Ellender Hose, MD

## 2019-03-19 NOTE — H&P (Addendum)
History and Physical    STEFHANIE KACHMAR JOI:[id number] DOB: January 19, 1951 DOA: 03/19/2019  PCP: [name], MD  Patient coming from: Home  I have personally briefly reviewed patient's old medical records in [facility]  Chief Complaint: Fevers, positive blood culture  HPI: [name] is a 68 y.o. female with medical history significant for type 2 diabetes and hypertension who presents to the ED for evaluation after having a positive blood culture result.  Patient initially presented to [facility] ED on 03/18/2019 for shortness of breath.  On arrival she was found to be febrile to 101.5 Fahrenheit with lactic acid of 2.1.  She had a two-view chest x-ray and CTA chest PE study which were negative for obvious infection or pulmonary embolus.  Urinalysis was somewhat suspicious for UTI.  Blood cultures were obtained, however urine culture was not performed.  SARS-CoV-2 test obtained 03/18/2019 was negative.  She was given 1 L normal saline with improvement in lactic acid.  She was given IV ceftriaxone 1 g and discharged to home with a prescription for oral Keflex.  Patient's blood culture ID resulted positive for E. coli today and patient was called to return to the ED for further management.  Patient does report having continued intermittent fevers.  She has had some periumbilical abdominal pain.  She reports increased urinary frequency without dysuria.  She otherwise denies any chills, diaphoresis, dyspnea, cough, chest pain, palpitations, diarrhea.  Patient does admit that she has stopped taking her home antihypertensives and diabetes medications for at least 3 months now and has not been checking her blood sugars at home.  ED Course:  Initial vitals on arrival showed BP 143/74, pulse 99, RR 20, temp 102.9 Fahrenheit, SPO2 97% on room air.  Labs are notable for WBC 11.7, hemoglobin 12.5, platelets 196,000, sodium 133, potassium 3.7, bicarb 24, BUN 10, creatinine 0.84, lactic  acid 1.9.  Repeat blood cultures and urine culture were obtained.  Patient was given 1 L normal saline and IV meropenem.  The hospitalist service was consulted to admit for further evaluation and management.  Review of Systems: All systems reviewed and are negative except as documented in history of present illness above.   Past Medical History:  Diagnosis Date   Diabetes mellitus without complication (HCC)    Hypertension     Past Surgical History:  Procedure Laterality Date   BACK SURGERY     VAGINAL DELIVERY      Social History:  reports that she has never smoked. She does not have any smokeless tobacco history on file. She reports that she does not drink alcohol. No history on file for drug.  No Known Allergies  No family history on file.   Prior to Admission medications   Medication Sig Start Date End Date Taking? Authorizing Provider  cephALEXin (KEFLEX) 500 MG capsule Take 1 capsule (500 mg total) by mouth 2 (two) times daily. 09/27/15   Governor Rooks, MD  cephALEXin (KEFLEX) 500 MG capsule Take 1 capsule (500 mg total) by mouth 4 (four) times daily for 10 days. 03/18/19 03/28/19  Emily Filbert, MD    Physical Exam: Vitals:   03/19/19 2255 03/19/19 2300 03/19/19 2330 03/20/19 0000  BP:  (!) 167/69 (!) 165/78 (!) 175/84  Pulse:  (!) 103 83 81  Resp:      Temp: 100.2 F (37.9 C)     TempSrc: Oral     SpO2:  100% 100% 100%  Weight:  Height:        Constitutional: Resting supine in bed, NAD, calm, comfortable Eyes: PERRL, lids and conjunctivae normal ENMT: Mucous membranes are moist. Posterior pharynx clear of any exudate or lesions.Normal dentition.  Neck: normal, supple, no masses. Respiratory: clear to auscultation bilaterally, no wheezing, no crackles. Normal respiratory effort. No accessory muscle use.  Cardiovascular: Regular rate and rhythm, systolic murmur present. No extremity edema. 2+ pedal pulses. Abdomen: no tenderness, no masses  palpated. No hepatosplenomegaly. Bowel sounds positive.  Musculoskeletal: no clubbing / cyanosis. No joint deformity upper and lower extremities. Good ROM, no contractures. Normal muscle tone.  Skin: no rashes, lesions, ulcers. No induration Neurologic: CN 2-12 grossly intact. Sensation intact, Strength 5/5 in all 4.  Psychiatric: Normal judgment and insight. Alert and oriented x 3. Normal mood.     Labs on Admission: I have personally reviewed following labs and imaging studies  CBC: Recent Labs  Lab 03/18/19 1743 03/19/19 1532  WBC 8.3 11.7*  NEUTROABS 6.4 8.6*  HGB 13.0 12.5  HCT 37.3 35.7*  MCV 87.8 88.4  PLT 222 202   Basic Metabolic Panel: Recent Labs  Lab 03/18/19 1743 03/19/19 1532  NA 137 133*  K 3.7 3.7  CL 103 99  CO2 22 24  GLUCOSE 221* 234*  BUN 12 10  CREATININE 0.75 0.84  CALCIUM 9.8 9.2   GFR: Estimated Creatinine Clearance: 69.5 mL/min (by C-G formula based on SCr of 0.84 mg/dL). Liver Function Tests: Recent Labs  Lab 03/18/19 1743 03/19/19 1532  AST 20 16  ALT 15 14  ALKPHOS 50 44  BILITOT 0.8 1.0  PROT 8.1 8.1  ALBUMIN 3.8 3.6   No results for input(s): LIPASE, AMYLASE in the last 168 hours. No results for input(s): AMMONIA in the last 168 hours. Coagulation Profile: Recent Labs  Lab 03/18/19 1743  INR 1.0   Cardiac Enzymes: No results for input(s): CKTOTAL, CKMB, CKMBINDEX, TROPONINI in the last 168 hours. BNP (last 3 results) No results for input(s): PROBNP in the last 8760 hours. HbA1C: No results for input(s): HGBA1C in the last 72 hours. CBG: Recent Labs  Lab 03/18/19 1741  GLUCAP 215*   Lipid Profile: No results for input(s): CHOL, HDL, LDLCALC, TRIG, CHOLHDL, LDLDIRECT in the last 72 hours. Thyroid Function Tests: No results for input(s): TSH, T4TOTAL, FREET4, T3FREE, THYROIDAB in the last 72 hours. Anemia Panel: No results for input(s): VITAMINB12, FOLATE, FERRITIN, TIBC, IRON, RETICCTPCT in the last 72  hours. Urine analysis:    Component Value Date/Time   COLORURINE YELLOW (A) 03/18/2019 1952   APPEARANCEUR HAZY (A) 03/18/2019 1952   LABSPEC 1.021 03/18/2019 1952   PHURINE 7.0 03/18/2019 1952   GLUCOSEU >=500 (A) 03/18/2019 1952   HGBUR SMALL (A) 03/18/2019 [place] NEGATIVE 03/18/2019 [place] NEGATIVE 03/18/2019 1952   PROTEINUR 30 (A) 03/18/2019 1952   NITRITE POSITIVE (A) 03/18/2019 1952   LEUKOCYTESUR SMALL (A) 03/18/2019 1952    Radiological Exams on Admission: Dg Chest 2 View  Result Date: 03/18/2019 CLINICAL DATA:  Suspected sepsis.  Shortness of breath. EXAM: CHEST - 2 VIEW COMPARISON:  March 10, 2013 FINDINGS: The heart, hila, mediastinum, and pleura are normal. No focal infiltrates. Nodules or masses. IMPRESSION: No cause for fever identified. Electronically Signed   By: Dorise Bullion III M.D   On: 03/18/2019 18:11   Ct Angio Chest Pe W And/or Wo Contrast  Result Date: 03/18/2019 CLINICAL DATA:  PE suspected, high pretest prob. Fever. Shortness  of breath. EXAM: CT ANGIOGRAPHY CHEST WITH CONTRAST TECHNIQUE: Multidetector CT imaging of the chest was performed using the standard protocol during bolus administration of intravenous contrast. Multiplanar CT image reconstructions and MIPs were obtained to evaluate the vascular anatomy. CONTRAST:  75mL OMNIPAQUE IOHEXOL 350 MG/ML SOLN COMPARISON:  Radiograph earlier this day. FINDINGS: Cardiovascular: There are no filling defects within the pulmonary arteries to suggest pulmonary embolus. The thoracic aorta is normal in caliber without dissection. Mild aortic atherosclerosis. Conventional branching pattern from the aortic arch. There are coronary artery calcifications. Upper normal heart size. No pericardial effusion. Mediastinum/Nodes: Small mediastinal and hilar nodes are not enlarged by size criteria. No visualized thyroid nodule. The esophagus is decompressed. Lungs/Pleura: Linear atelectasis or scarring in  the lingula. No confluent airspace disease. Head mm perifissural nodule in the left lower lobe series 6, image 44. Tiny subpleural nodule in the right lower lobe, series 6, image 51. No pulmonary edema. No pleural fluid. Trachea and mainstem bronchi are patent. Upper Abdomen: Upper abdominal atherosclerosis. No acute findings. Suggestion of a hepatic steatosis. Musculoskeletal: Mild multilevel degenerative change in the spine. There are no acute or suspicious osseous abnormalities. Review of the MIP images confirms the above findings. IMPRESSION: 1. No pulmonary embolus or acute intrathoracic abnormality. No evidence of pneumonia. 2. Coronary artery calcifications. 3. Tiny bilateral subpleural and perifissural nodules, largest 5 mm in the left lower lobe. Suspect these are intrapulmonary lymph nodes, however recommend follow-up if patient is high-risk. No follow-up needed if patient is low-risk (and has no known or suspected primary neoplasm). Non-contrast chest CT can be considered in 12 months if patient is high-risk. This recommendation follows the consensus statement: Guidelines for Management of Incidental Pulmonary Nodules Detected on CT Images: From the Fleischner Society 2017; Radiology 2017; 284:228-243. Aortic Atherosclerosis (ICD10-I70.0). Electronically Signed   By: Narda RutherfordMelanie  Sanford M.D.   On: 03/18/2019 19:26    EKG: EKG 03/18/2019: Sinus arrhythmia with motion artifact, QTC 421.  Assessment/Plan Principal Problem:   E coli bacteremia Active Problems:   Diabetes mellitus without complication (HCC)   Hypertension associated with diabetes (HCC)  irginia B Lin GivensJeffries is a 68 y.o. female with medical history significant for type 2 diabetes and hypertension who is admitted with E. coli bacteremia.   E. coli bacteremia: Presumably due to urinary source.  Blood cultures from 03/18/2019 + for E. coli, awaiting sensitivities.  Urine culture was not obtained from that date but repeat collected  03/19/2019.  She was given IV meropenem in the ED. -Will switch to IV ceftriaxone 2 g daily pending sensitivities -Follow repeat blood cultures 03/19/2019 and urine culture 03/19/2019 -Obtain echocardiogram  Type 2 diabetes: Previously on Metformin and Jardiance, however states that she has not taken these for several months now.  Recheck A1c and continue sensitive SSI while in hospital.  Hypertension: Reports not taking lisinopril for several months.  Will restart lisinopril 10 mg daily.  DVT prophylaxis: Lovenox Code Status: Full code, confirmed with patient Family Communication: Discussed with patient, she has discussed with her daughter Disposition Plan: Pending clinical progress Consults called: None Admission status: Inpatient, patient will likely require greater than 2 midnight length stay for continued IV antibiotic therapy of E. coli bacteremia while awaiting further cultural data.   Darreld McleanVishal Marcene Laskowski MD [facility]  If 7PM-7AM, please contact night-coverage [email or web address]  03/20/2019, 12:21 AM

## 2019-03-19 NOTE — Telephone Encounter (Signed)
I called ms Cuffie and informed her of positive blood culture.  She agrees to return to ED today.

## 2019-03-19 NOTE — Progress Notes (Signed)
Pharmacy Antibiotic Note  Ashley Ellison is a 68 y.o. female admitted on 03/19/2019 with UTI.  Pharmacy has been consulted for Meropenem dosing.  Reportedly being treated for ESBL.  Plan: Meropenem 1gm IV q8h  Height: 5\' 7"  (170.2 cm) Weight: 180 lb (81.6 kg) IBW/kg (Calculated) : 61.6  Temp (24hrs), Avg:100.4 F (38 C), Min:99 F (37.2 C), Max:102.9 F (39.4 C)  Recent Labs  Lab 03/18/19 1743 03/18/19 1952 03/19/19 1532 03/19/19 1551  WBC 8.3  --  11.7*  --   CREATININE 0.75  --  0.84  --   LATICACIDVEN 2.1* 1.8  --  1.9    Estimated Creatinine Clearance: 69.5 mL/min (by C-G formula based on SCr of 0.84 mg/dL).    No Known Allergies  Antimicrobials this admission: Rocephin 11/12 x 1 Meropenem 11/13 >>   Dose adjustments this admission:   Microbiology results:  BCx:   UCx:    Sputum:    MRSA PCR:   Thank you for allowing pharmacy to be a part of this patient's care.  [name] 03/19/2019 10:26 PM

## 2019-03-19 NOTE — ED Notes (Signed)
Bed adjusted for comfort, lights dimmed for comfort. Pt denies needs at this time.

## 2019-03-19 NOTE — ED Triage Notes (Signed)
Pt here last pm and was called today and told to come back due to a positive culture.  Pt denies sx's.

## 2019-03-19 NOTE — ED Notes (Signed)
Isaacs, MD notified of pt temperature of 100.2, told to continue to monitor pt status

## 2019-03-19 NOTE — ED Provider Notes (Addendum)
[facility] Emergency Department Provider Note  ____________________________________________   First MD Initiated Contact with Patient 03/19/19 2029     (approximate)  I have reviewed the triage vital signs and the nursing notes.   HISTORY  Chief Complaint Abnormal Lab    HPI Ashley Ellison is a 68 y.o. female  Here with positive blood culture. Pt was just seen yesterday for fever, nausea, general fatigue. She had an overall reassuring exam at that time and was given rocephin for possible UTI. She was called back today 2/2 +EColi blood culture. Pt states that she has had ongoing fatigue, chills, and low grade fever but otherwise feels similar to improved. She denies any vaginal bleeding or discharge. Mild urinary frequency noted but otherwise no dysuria, no flank pain. No cough or SOB.        Past Medical History:  Diagnosis Date  . Diabetes mellitus without complication ([place])   . Hypertension     Patient Active Problem List   Diagnosis Date Noted  . E coli bacteremia 03/19/2019  . Diabetes mellitus without complication ([place])   . Hypertension associated with diabetes [place])     Past Surgical History:  Procedure Laterality Date  . BACK SURGERY    . VAGINAL DELIVERY      Prior to Admission medications   Medication Sig Start Date End Date Taking? Authorizing Provider  cephALEXin (KEFLEX) 500 MG capsule Take 1 capsule (500 mg total) by mouth 4 (four) times daily for 10 days. 03/18/19 03/28/19 Yes Earleen Newport, MD    Allergies Patient has no known allergies.  No family history on file.  Social History Social History   Tobacco Use  . Smoking status: Never Smoker  Substance Use Topics  . Alcohol use: No  . Drug use: Not on file    Review of Systems  Review of Systems  Constitutional: Positive for chills, fatigue and fever.  HENT: Negative for congestion and sore throat.   Eyes: Negative for visual disturbance.   Respiratory: Negative for cough and shortness of breath.   Cardiovascular: Negative for chest pain.  Gastrointestinal: Negative for abdominal pain, diarrhea, nausea and vomiting.  Genitourinary: Negative for flank pain.  Musculoskeletal: Negative for back pain and neck pain.  Skin: Negative for rash and wound.  Neurological: Negative for weakness.     ____________________________________________  PHYSICAL EXAM:      VITAL SIGNS: ED Triage Vitals [03/19/19 1530]  Enc Vitals Group     BP (!) 143/74     Pulse Rate 99     Resp 20     Temp (!) 102.9 F (39.4 C)     Temp Source Oral     SpO2 97 %     Weight 180 lb (81.6 kg)     Height 5\' 7"  (1.702 m)     Head Circumference      Peak Flow      Pain Score 0     Pain Loc      Pain Edu?      Excl. in [place]?      Physical Exam Vitals signs and nursing note reviewed.  Constitutional:      General: She is not in acute distress.    Appearance: She is well-developed.  HENT:     Head: Normocephalic and atraumatic.  Eyes:     Conjunctiva/sclera: Conjunctivae normal.  Neck:     Musculoskeletal: Neck supple.  Cardiovascular:     Rate and Rhythm: Normal  rate and regular rhythm.     Heart sounds: Normal heart sounds. No murmur. No friction rub.  Pulmonary:     Effort: Pulmonary effort is normal. No respiratory distress.     Breath sounds: Normal breath sounds. No wheezing or rales.  Abdominal:     General: There is no distension.     Palpations: Abdomen is soft.     Tenderness: There is no abdominal tenderness.  Skin:    General: Skin is warm.     Capillary Refill: Capillary refill takes less than 2 seconds.  Neurological:     Mental Status: She is alert and oriented to person, place, and time.     Motor: No abnormal muscle tone.       ____________________________________________   LABS (all labs ordered are listed, but only abnormal results are displayed)  Labs Reviewed  COMPREHENSIVE METABOLIC PANEL - Abnormal;  Notable for the following components:      Result Value   Sodium 133 (*)    Glucose, Bld 234 (*)    All other components within normal limits  CBC WITH DIFFERENTIAL/PLATELET - Abnormal; Notable for the following components:   WBC 11.7 (*)    HCT 35.7 (*)    RDW 11.4 (*)    Neutro Abs 8.6 (*)    Monocytes Absolute 1.1 (*)    All other components within normal limits  CULTURE, BLOOD (ROUTINE X 2)  CULTURE, BLOOD (ROUTINE X 2)  URINE CULTURE  LACTIC ACID, PLASMA  HIV ANTIBODY (ROUTINE TESTING W REFLEX)  CBC  BASIC METABOLIC PANEL  HEMOGLOBIN A1C    ____________________________________________  EKG: Normal sinus rhythm with sinus arrhythmia, VR 98. PR 146, QRS 72, QTc 421. Non-specific TWC in lateral precordial leads, no acute ischemia. ________________________________________  RADIOLOGY All imaging, including plain films, CT scans, and ultrasounds, independently reviewed by me, and interpretations confirmed via formal radiology reads.  ED MD interpretation:   None  Official radiology report(s): No results found.  ____________________________________________  PROCEDURES   Procedure(s) performed (including Critical Care):  Procedures  ____________________________________________  INITIAL IMPRESSION / MDM / ASSESSMENT AND PLAN / ED COURSE  As part of my medical decision making, I reviewed the following data within the electronic MEDICAL RECORD NUMBER Nursing notes reviewed and incorporated, Old chart reviewed, Notes from prior ED visits, and [place] Controlled Substance Database       *Ashley Ellison was evaluated in Emergency Department on 03/20/2019 for the symptoms described in the history of present illness. She was evaluated in the context of the global COVID-19 pandemic, which necessitated consideration that the patient might be at risk for infection with the SARS-CoV-2 virus that causes COVID-19. Institutional protocols and algorithms that pertain to the evaluation  of patients at risk for COVID-19 are in a state of rapid change based on information released by regulatory bodies including the CDC and federal and state organizations. These policies and algorithms were followed during the patient's care in the ED.  Some ED evaluations and interventions may be delayed as a result of limited staffing during the pandemic.*     Medical Decision Making:  68 yo F here with positive blood culture following ED eval for fever, UTI yesterday. Reviewed culture results with pharmacy. Per pharm recommendations, will cover initially with meropenem     ____________________________________________  FINAL CLINICAL IMPRESSION(S) / ED DIAGNOSES  Final diagnoses:  Positive blood culture     MEDICATIONS GIVEN DURING THIS VISIT:  Medications  enoxaparin (LOVENOX) injection 40 mg (  has no administration in time range)  0.9 %  sodium chloride infusion ( Intravenous New Bag/Given 03/20/19 0133)  acetaminophen (TYLENOL) tablet 650 mg (has no administration in time range)    Or  acetaminophen (TYLENOL) suppository 650 mg (has no administration in time range)  ondansetron (ZOFRAN) tablet 4 mg (has no administration in time range)    Or  ondansetron (ZOFRAN) injection 4 mg (has no administration in time range)  cefTRIAXone (ROCEPHIN) 2 g in sodium chloride 0.9 % 100 mL IVPB (has no administration in time range)  insulin aspart (novoLOG) injection 0-9 Units (has no administration in time range)  insulin aspart (novoLOG) injection 0-5 Units (2 Units Subcutaneous Given 03/20/19 0151)  lisinopril (ZESTRIL) tablet 10 mg (10 mg Oral Given 03/20/19 0030)  acetaminophen (TYLENOL) tablet 1,000 mg (1,000 mg Oral Given 03/19/19 1534)  sodium chloride 0.9 % bolus 1,000 mL (0 mLs Intravenous Stopped 03/19/19 2324)     ED Discharge Orders    None       Note:  This document was prepared using Dragon voice recognition software and may include unintentional dictation errors.    Shaune PollackIsaacs, Rya Rausch, MD 03/20/19 Wendie [id number]    Shaune PollackIsaacs, Seaira Byus, MD 04/02/19 2153

## 2019-03-20 DIAGNOSIS — E1159 Type 2 diabetes mellitus with other circulatory complications: Secondary | ICD-10-CM

## 2019-03-20 DIAGNOSIS — E119 Type 2 diabetes mellitus without complications: Secondary | ICD-10-CM

## 2019-03-20 DIAGNOSIS — I10 Essential (primary) hypertension: Secondary | ICD-10-CM

## 2019-03-20 LAB — CBC
HCT: 33.6 % — ABNORMAL LOW (ref 36.0–46.0)
Hemoglobin: 11.7 g/dL — ABNORMAL LOW (ref 12.0–15.0)
MCH: 30.7 pg (ref 26.0–34.0)
MCHC: 34.8 g/dL (ref 30.0–36.0)
MCV: 88.2 fL (ref 80.0–100.0)
Platelets: 172 10*3/uL (ref 150–400)
RBC: 3.81 MIL/uL — ABNORMAL LOW (ref 3.87–5.11)
RDW: 11.5 % (ref 11.5–15.5)
WBC: 8.9 10*3/uL (ref 4.0–10.5)
nRBC: 0 % (ref 0.0–0.2)

## 2019-03-20 LAB — BASIC METABOLIC PANEL
Anion gap: 8 (ref 5–15)
BUN: 14 mg/dL (ref 8–23)
CO2: 23 mmol/L (ref 22–32)
Calcium: 8.9 mg/dL (ref 8.9–10.3)
Chloride: 106 mmol/L (ref 98–111)
Creatinine, Ser: 0.85 mg/dL (ref 0.44–1.00)
GFR calc Af Amer: >60 mL/min (ref >60)
GFR calc non Af Amer: >60 mL/min (ref >60)
Glucose, Bld: 191 mg/dL — ABNORMAL HIGH (ref 70–99)
Potassium: 3.4 mmol/L — ABNORMAL LOW (ref 3.5–5.1)
Sodium: 137 mmol/L (ref 135–145)

## 2019-03-20 LAB — GLUCOSE, CAPILLARY
Glucose-Capillary: 173 mg/dL — ABNORMAL HIGH (ref 70–99)
Glucose-Capillary: 199 mg/dL — ABNORMAL HIGH (ref 70–99)

## 2019-03-20 MED ORDER — LISINOPRIL 10 MG PO TABS
10.0000 mg | ORAL_TABLET | Freq: Every day | ORAL | Status: DC
  Administered 2019-03-20 – 2019-03-21 (×3): 10 mg via ORAL
  Filled 2019-03-20 (×3): qty 1

## 2019-03-20 NOTE — Progress Notes (Signed)
Pt alert and oriented x4, no complaints of pain or discomfort.  Bed in low position, call bell within reach.  Bed alarms on and functioning.  Assessment done and charted.  Will continue to monitor and do hourly rounding throughout the shift 

## 2019-03-20 NOTE — ED Notes (Signed)
Spoke with scott, pharmacist regarding rocephin order and cancel of merapenem with bacteremia. Scott to consult with dr. patel regarding order.

## 2019-03-20 NOTE — Progress Notes (Signed)
PROGRESS NOTE    Ashley Ellison  PIR:[id number] DOB: October 17, 1950 DOA: 03/19/2019 PCP: Kirk Ruths, MD   Brief Narrative:  There is a 68 year old female with a medical history notable for type 2 diabetes, hypertension who represents to the ED after being called with a positive blood culture.  Patient initially was seen in the ED on November 12 for shortness of breath she was found to be febrile with a lactic acid 2.1.  Work-up was concerning for UTI and patient was discharged home on antibiotics.  Blood cultures been collected during that ER visit and subsequently returned positive.  Patient was contacted and represented for admission.   Assessment & Plan:   Principal Problem:   E coli bacteremia Active Problems:   Diabetes mellitus without complication ([place])   Hypertension associated with diabetes ([place])   E. coli bacteremia: Presumably due to urinary source.  Blood cultures from 03/18/2019 + for E. coli, awaiting sensitivities.  Urine culture was not obtained from that date but repeat collected 03/19/2019.  She was given IV meropenem in the ED. -Will c/w IV ceftriaxone 2 g daily pending sensitivities -Follow repeat blood cultures 03/19/2019 and urine culture 03/19/2019 -f/up echocardiogram  Type 2 diabetes: Previously on Metformin and Jardiance, however states that she has not taken these for several months now.   Order A1c  continue sensitive SSI while in hospital. An extensive discussion with the patient on the importance of medication and medical compliance given her persistently elevated blood sugars in the 250s and nonadherence to medications  Hypertension: Reports not taking lisinopril for several months.   Continue with lisinopril 10 mg daily.  DVT prophylaxis: Lovenox SQ  Code Status: Full code    Code Status Orders  (From admission, onward)         Start     Ordered   03/19/19 2351  Full code  Continuous     03/19/19 2351        Code Status  History    This patient has a current code status but no historical code status.   Advance Care Planning Activity     Family Communication: Discussed in detail with patient Disposition Plan:   Patient remained inpatient for IV antibiotics, patient not yet ready for medical discharge. Consults called: None Admission status: Inpatient   Consultants:   None  Procedures:  Dg Chest 2 View  Result Date: 03/18/2019 CLINICAL DATA:  Suspected sepsis.  Shortness of breath. EXAM: CHEST - 2 VIEW COMPARISON:  March 10, 2013 FINDINGS: The heart, hila, mediastinum, and pleura are normal. No focal infiltrates. Nodules or masses. IMPRESSION: No cause for fever identified. Electronically Signed   By: Dorise Bullion III M.D   On: 03/18/2019 18:11   Ct Angio Chest Pe W And/or Wo Contrast  Result Date: 03/18/2019 CLINICAL DATA:  PE suspected, high pretest prob. Fever. Shortness of breath. EXAM: CT ANGIOGRAPHY CHEST WITH CONTRAST TECHNIQUE: Multidetector CT imaging of the chest was performed using the standard protocol during bolus administration of intravenous contrast. Multiplanar CT image reconstructions and MIPs were obtained to evaluate the vascular anatomy. CONTRAST:  57mL OMNIPAQUE IOHEXOL 350 MG/ML SOLN COMPARISON:  Radiograph earlier this day. FINDINGS: Cardiovascular: There are no filling defects within the pulmonary arteries to suggest pulmonary embolus. The thoracic aorta is normal in caliber without dissection. Mild aortic atherosclerosis. Conventional branching pattern from the aortic arch. There are coronary artery calcifications. Upper normal heart size. No pericardial effusion. Mediastinum/Nodes: Small mediastinal and hilar nodes are not  enlarged by size criteria. No visualized thyroid nodule. The esophagus is decompressed. Lungs/Pleura: Linear atelectasis or scarring in the lingula. No confluent airspace disease. Head mm perifissural nodule in the left lower lobe series 6, image 44. Tiny  subpleural nodule in the right lower lobe, series 6, image 51. No pulmonary edema. No pleural fluid. Trachea and mainstem bronchi are patent. Upper Abdomen: Upper abdominal atherosclerosis. No acute findings. Suggestion of a hepatic steatosis. Musculoskeletal: Mild multilevel degenerative change in the spine. There are no acute or suspicious osseous abnormalities. Review of the MIP images confirms the above findings. IMPRESSION: 1. No pulmonary embolus or acute intrathoracic abnormality. No evidence of pneumonia. 2. Coronary artery calcifications. 3. Tiny bilateral subpleural and perifissural nodules, largest 5 mm in the left lower lobe. Suspect these are intrapulmonary lymph nodes, however recommend follow-up if patient is high-risk. No follow-up needed if patient is low-risk (and has no known or suspected primary neoplasm). Non-contrast chest CT can be considered in 12 months if patient is high-risk. This recommendation follows the consensus statement: Guidelines for Management of Incidental Pulmonary Nodules Detected on CT Images: From the Fleischner Society 2017; Radiology 2017; 284:228-243. Aortic Atherosclerosis (ICD10-I70.0). Electronically Signed   By: Narda Rutherford M.D.   On: 03/18/2019 19:26     Antimicrobials:   Ceftriaxone 2 g every 24 hours   Subjective: Patient admitted overnight No acute events Patient was seen in ER  Objective: Vitals:   03/20/19 0524 03/20/19 0909 03/20/19 1200 03/20/19 1550  BP: (!) 143/67 (!) 165/76 (!) 154/68 131/61  Pulse: 88 78 74 62  Resp: 18 20 18 16   Temp: 100 F (37.8 C) 99.5 F (37.5 C) 100 F (37.8 C) 98.9 F (37.2 C)  TempSrc: Oral Oral Oral Oral  SpO2: 97% 100% 100% 100%  Weight:      Height:        Intake/Output Summary (Last 24 hours) at 03/20/2019 1622 Last data filed at 03/20/2019 1100 Gross per 24 hour  Intake 1220 ml  Output --  Net 1220 ml   Filed Weights   03/19/19 1530  Weight: 81.6 kg    Examination:  General  exam: Appears calm and comfortable  Respiratory system: Clear to auscultation. Respiratory effort normal. Cardiovascular system: S1 & S2 heard, RRR. No JVD, murmurs, rubs, gallops or clicks. No pedal edema. Gastrointestinal system: Abdomen is nondistended, soft and nontender. No organomegaly or masses felt. Normal bowel sounds heard. Central nervous system: Alert and oriented. No focal neurological deficits. Extremities: Warm well perfused, no edema, neurovascularly intact. Skin: No rashes, lesions or ulcers Psychiatry: Judgement and insight appear normal. Mood & affect appropriate.     Data Reviewed: I have personally reviewed following labs and imaging studies  CBC: Recent Labs  Lab 03/18/19 1743 03/19/19 1532  WBC 8.3 11.7*  NEUTROABS 6.4 8.6*  HGB 13.0 12.5  HCT 37.3 35.7*  MCV 87.8 88.4  PLT 222 196   Basic Metabolic Panel: Recent Labs  Lab 03/18/19 1743 03/19/19 1532  NA 137 133*  K 3.7 3.7  CL 103 99  CO2 22 24  GLUCOSE 221* 234*  BUN 12 10  CREATININE 0.75 0.84  CALCIUM 9.8 9.2   GFR: Estimated Creatinine Clearance: 69.5 mL/min (by C-G formula based on SCr of 0.84 mg/dL). Liver Function Tests: Recent Labs  Lab 03/18/19 1743 03/19/19 1532  AST 20 16  ALT 15 14  ALKPHOS 50 44  BILITOT 0.8 1.0  PROT 8.1 8.1  ALBUMIN 3.8 3.6   No  results for input(s): LIPASE, AMYLASE in the last 168 hours. No results for input(s): AMMONIA in the last 168 hours. Coagulation Profile: Recent Labs  Lab 03/18/19 1743  INR 1.0   Cardiac Enzymes: No results for input(s): CKTOTAL, CKMB, CKMBINDEX, TROPONINI in the last 168 hours. BNP (last 3 results) No results for input(s): PROBNP in the last 8760 hours. HbA1C: No results for input(s): HGBA1C in the last 72 hours. CBG: Recent Labs  Lab 03/18/19 1741  GLUCAP 215*   Lipid Profile: No results for input(s): CHOL, HDL, LDLCALC, TRIG, CHOLHDL, LDLDIRECT in the last 72 hours. Thyroid Function Tests: No results for  input(s): TSH, T4TOTAL, FREET4, T3FREE, THYROIDAB in the last 72 hours. Anemia Panel: No results for input(s): VITAMINB12, FOLATE, FERRITIN, TIBC, IRON, RETICCTPCT in the last 72 hours. Sepsis Labs: Recent Labs  Lab 03/18/19 1743 03/18/19 1952 03/19/19 1551  LATICACIDVEN 2.1* 1.8 1.9    Recent Results (from the past 240 hour(s))  Culture, blood (Routine x 2)     Status: None (Preliminary result)   Collection Time: 03/18/19  5:40 PM   Specimen: BLOOD  Result Value Ref Range Status   Specimen Description BLOOD RIGHT ANTECUBITAL  Final   Special Requests   Final    BOTTLES DRAWN AEROBIC AND ANAEROBIC Blood Culture adequate volume   Culture   Final    NO GROWTH 2 DAYS Performed at [facility], [address], [place], [place] [address]    Report Status PENDING  Incomplete  Culture, blood (Routine x 2)     Status: Abnormal (Preliminary result)   Collection Time: 03/18/19  5:43 PM   Specimen: BLOOD  Result Value Ref Range Status   Specimen Description   Final    BLOOD LEFT ANTECUBITAL Performed at [facility], [address] [place], [place] [address]    Special Requests   Final    BOTTLES DRAWN AEROBIC AND ANAEROBIC Blood Culture adequate volume Performed at [facility], [address], [place], [place] [address]    Culture  Setup Time   Final    GRAM NEGATIVE RODS ANAEROBIC BOTTLE ONLY CRITICAL RESULT CALLED TO, READ BACK BY AND VERIFIED WITH: Sebastian Ache AT 5366 03/19/2019 SDR Performed at [facility], [address], [place], [place] [address]    Culture ESCHERICHIA COLI (A)  Final   Report Status PENDING  Incomplete  Blood Culture ID Panel (Reflexed)     Status: Abnormal   Collection Time: 03/18/19  5:43 PM  Result Value Ref Range Status   Enterococcus species NOT DETECTED NOT DETECTED Final   Listeria monocytogenes NOT DETECTED NOT DETECTED Final   Staphylococcus species NOT DETECTED NOT DETECTED Final   Staphylococcus aureus  (BCID) NOT DETECTED NOT DETECTED Final   Streptococcus species NOT DETECTED NOT DETECTED Final   Streptococcus agalactiae NOT DETECTED NOT DETECTED Final   Streptococcus pneumoniae NOT DETECTED NOT DETECTED Final   Streptococcus pyogenes NOT DETECTED NOT DETECTED Final   Acinetobacter baumannii NOT DETECTED NOT DETECTED Final   Enterobacteriaceae species DETECTED (A) NOT DETECTED Final    Comment: Enterobacteriaceae represent a large family of gram-negative bacteria, not a single organism. CRITICAL RESULT CALLED TO, READ BACK BY AND VERIFIED WITH:  GREG MOYER AT 0720 03/19/2019 SDR    Enterobacter cloacae complex NOT DETECTED NOT DETECTED Final   Escherichia coli DETECTED (A) NOT DETECTED Final    Comment: CRITICAL RESULT CALLED TO, READ BACK BY AND VERIFIED WITH:  GREG MOYER AT 0720  03/19/2019 SDR    Klebsiella oxytoca NOT DETECTED NOT DETECTED Final   Klebsiella pneumoniae NOT DETECTED NOT DETECTED Final   Proteus species NOT DETECTED NOT DETECTED Final   Serratia marcescens NOT DETECTED NOT DETECTED Final   Carbapenem resistance NOT DETECTED NOT DETECTED Final   Haemophilus influenzae NOT DETECTED NOT DETECTED Final   Neisseria meningitidis NOT DETECTED NOT DETECTED Final   Pseudomonas aeruginosa NOT DETECTED NOT DETECTED Final   Candida albicans NOT DETECTED NOT DETECTED Final   Candida glabrata NOT DETECTED NOT DETECTED Final   Candida krusei NOT DETECTED NOT DETECTED Final   Candida parapsilosis NOT DETECTED NOT DETECTED Final   Candida tropicalis NOT DETECTED NOT DETECTED Final    Comment: Performed at [facility], [address] [place], [place] [address]  SARS CORONAVIRUS 2 (TAT 6-24 HRS) Nasopharyngeal Nasopharyngeal Swab     Status: None   Collection Time: 03/18/19  6:24 PM   Specimen: Nasopharyngeal Swab  Result Value Ref Range Status   SARS Coronavirus 2 NEGATIVE NEGATIVE Final    Comment: (NOTE) SARS-CoV-2 target nucleic acids are NOT DETECTED. The  SARS-CoV-2 RNA is generally detectable in upper and lower respiratory specimens during the acute phase of infection. Negative results do not preclude SARS-CoV-2 infection, do not rule out co-infections with other pathogens, and should not be used as the sole basis for treatment or other patient management decisions. Negative results must be combined with clinical observations, patient history, and epidemiological information. The expected result is Negative. Fact Sheet for Patients: HairSlick.nohttps://www.fda.gov/media/138098/download Fact Sheet for Healthcare Providers: quierodirigir.comhttps://www.fda.gov/media/138095/download This test is not yet approved or cleared by the [place] FDA and  has been authorized for detection and/or diagnosis of SARS-CoV-2 by FDA under an Emergency Use Authorization (EUA). This EUA will remain  in effect (meaning this test can be used) for the duration of the COVID-19 declaration under Section 56 4(b)(1) of the Act, 21 U.S.C. section 360bbb-3(b)(1), unless the authorization is terminated or revoked sooner. Performed at [facility], [address] [place], [place] [address]   Blood culture (routine x 2)     Status: None (Preliminary result)   Collection Time: 03/19/19  8:59 PM   Specimen: BLOOD  Result Value Ref Range Status   Specimen Description BLOOD RIGHT ANTECUBITAL  Final   Special Requests   Final    BOTTLES DRAWN AEROBIC AND ANAEROBIC Blood Culture results may not be optimal due to an excessive volume of blood received in culture bottles   Culture   Final    NO GROWTH < 12 HOURS Performed at [facility], [address] [place], [place] [address]    Report Status PENDING  Incomplete  Blood culture (routine x 2)     Status: None (Preliminary result)   Collection Time: 03/19/19  9:01 PM   Specimen: BLOOD  Result Value Ref Range Status   Specimen Description BLOOD LEFT ANTECUBITAL  Final   Special Requests   Final    BOTTLES DRAWN AEROBIC  AND ANAEROBIC Blood Culture results may not be optimal due to an excessive volume of blood received in culture bottles   Culture   Final    NO GROWTH < 12 HOURS Performed at [facility], [address] [place], [place] [address]    Report Status PENDING  Incomplete         Radiology Studies: Dg Chest 2 View  Result Date: 03/18/2019 CLINICAL DATA:  Suspected sepsis.  Shortness of breath. EXAM:  CHEST - 2 VIEW COMPARISON:  March 10, 2013 FINDINGS: The heart, hila, mediastinum, and pleura are normal. No focal infiltrates. Nodules or masses. IMPRESSION: No cause for fever identified. Electronically Signed   By: Gerome Sam III M.D   On: 03/18/2019 18:11   Ct Angio Chest Pe W And/or Wo Contrast  Result Date: 03/18/2019 CLINICAL DATA:  PE suspected, high pretest prob. Fever. Shortness of breath. EXAM: CT ANGIOGRAPHY CHEST WITH CONTRAST TECHNIQUE: Multidetector CT imaging of the chest was performed using the standard protocol during bolus administration of intravenous contrast. Multiplanar CT image reconstructions and MIPs were obtained to evaluate the vascular anatomy. CONTRAST:  75mL OMNIPAQUE IOHEXOL 350 MG/ML SOLN COMPARISON:  Radiograph earlier this day. FINDINGS: Cardiovascular: There are no filling defects within the pulmonary arteries to suggest pulmonary embolus. The thoracic aorta is normal in caliber without dissection. Mild aortic atherosclerosis. Conventional branching pattern from the aortic arch. There are coronary artery calcifications. Upper normal heart size. No pericardial effusion. Mediastinum/Nodes: Small mediastinal and hilar nodes are not enlarged by size criteria. No visualized thyroid nodule. The esophagus is decompressed. Lungs/Pleura: Linear atelectasis or scarring in the lingula. No confluent airspace disease. Head mm perifissural nodule in the left lower lobe series 6, image 44. Tiny subpleural nodule in the right lower lobe, series 6, image 51. No  pulmonary edema. No pleural fluid. Trachea and mainstem bronchi are patent. Upper Abdomen: Upper abdominal atherosclerosis. No acute findings. Suggestion of a hepatic steatosis. Musculoskeletal: Mild multilevel degenerative change in the spine. There are no acute or suspicious osseous abnormalities. Review of the MIP images confirms the above findings. IMPRESSION: 1. No pulmonary embolus or acute intrathoracic abnormality. No evidence of pneumonia. 2. Coronary artery calcifications. 3. Tiny bilateral subpleural and perifissural nodules, largest 5 mm in the left lower lobe. Suspect these are intrapulmonary lymph nodes, however recommend follow-up if patient is high-risk. No follow-up needed if patient is low-risk (and has no known or suspected primary neoplasm). Non-contrast chest CT can be considered in 12 months if patient is high-risk. This recommendation follows the consensus statement: Guidelines for Management of Incidental Pulmonary Nodules Detected on CT Images: From the Fleischner Society 2017; Radiology 2017; 284:228-243. Aortic Atherosclerosis (ICD10-I70.0). Electronically Signed   By: Narda Rutherford M.D.   On: 03/18/2019 19:26        Scheduled Meds:  enoxaparin (LOVENOX) injection  40 mg Subcutaneous Q24H   insulin aspart  0-5 Units Subcutaneous QHS   insulin aspart  0-9 Units Subcutaneous TID WC   lisinopril  10 mg Oral Daily   Continuous Infusions:  cefTRIAXone (ROCEPHIN)  IV Stopped (03/20/19 0747)     LOS: 1 day    Time spent: 35 min    Burke Keels, MD [facility]  If 7PM-7AM, please contact night-coverage  03/20/2019, 4:22 PM

## 2019-03-20 NOTE — ED Notes (Signed)
Called Scott in pharmacy to discuss lisinopril dosage. Told to give 0015 dose and 1000 dose would be rescheduled

## 2019-03-21 ENCOUNTER — Inpatient Hospital Stay
Admit: 2019-03-21 | Discharge: 2019-03-21 | Disposition: A | Discharge status: Home / Self Care | Payer: Medicare Other | Attending: Internal Medicine | Admitting: Internal Medicine

## 2019-03-21 LAB — GLUCOSE, CAPILLARY
Glucose-Capillary: 209 mg/dL — ABNORMAL HIGH (ref 70–99)
Glucose-Capillary: 171 mg/dL — ABNORMAL HIGH (ref 70–99)
Glucose-Capillary: 176 mg/dL — ABNORMAL HIGH (ref 70–99)
Glucose-Capillary: 171 mg/dL — ABNORMAL HIGH (ref 70–99)
Glucose-Capillary: 147 mg/dL — ABNORMAL HIGH (ref 70–99)

## 2019-03-21 LAB — ECHOCARDIOGRAM COMPLETE
Height: 67 in
Weight: 2880 oz

## 2019-03-21 LAB — CULTURE, BLOOD (ROUTINE X 2): Special Requests: ADEQUATE

## 2019-03-21 LAB — URINE CULTURE
Culture: <10000 — AB
Special Requests: NORMAL

## 2019-03-21 LAB — HEMOGLOBIN A1C
Hgb A1c MFr Bld: 10.1 % — ABNORMAL HIGH (ref 4.8–5.6)
Mean Plasma Glucose: 243.17 mg/dL

## 2019-03-21 MED ORDER — LISINOPRIL 10 MG PO TABS: 10.0000 mg | ORAL_TABLET | Freq: Every day | ORAL | 0 refills | Status: DC

## 2019-03-21 MED ORDER — METFORMIN HCL 500 MG PO TABS: 500.0000 mg | ORAL_TABLET | Freq: Two times a day (BID) | ORAL | 11 refills | Status: DC

## 2019-03-21 MED ORDER — CEFDINIR 300 MG PO CAPS: 300.0000 mg | ORAL_CAPSULE | Freq: Two times a day (BID) | ORAL | 0 refills | Status: AC

## 2019-03-21 MED ORDER — JARDIANCE 25 MG PO TABS: 25.0000 mg | ORAL_TABLET | Freq: Every day | ORAL | 0 refills | Status: AC

## 2019-03-21 NOTE — Discharge Summary (Signed)
Physician Discharge Summary  Ashley Ellison TDV:[id number] DOB: 06-01-1950 DOA: 03/19/2019  PCP: Kirk Ruths, MD  Admit date: 03/19/2019 Discharge date: 03/21/2019  Admitted From: Inpatient Disposition: home  Recommendations for Outpatient Follow-up:  1. Follow up with PCP in 1-2 weeks   Home Health:No Equipment/Devices:none  Discharge Condition:Stable CODE STATUS:Full code Diet recommendation: Diabetic diet  Brief/Interim Summary: There is a 68 year old female with a medical history notable for type 2 diabetes, hypertension who represents to the ED after being called with a positive blood culture.  Patient initially was seen in the ED on November 12 for shortness of breath she was found to be febrile with a lactic acid 2.1.  Work-up was concerning for UTI and patient was discharged home on antibiotics.  Blood cultures been collected during that ER visit and subsequently returned positive.  Patient was contacted and represented for admission  Hospital Course: E. coli bacteremia: Presumably due to urinary source. Blood cultures from 03/18/2019 + for E. Coli-sensitivities reviewed. She was given IV meropenem in the ED. - c/w IV ceftriaxone 2 g daily while in the hospital and will comlete course of omnicef for a total of seven days -repeat blood cultures negative and urine culture with insignificant growth  Type 2 diabetes: Previously on Metformin and Jardiance, however states that she has not taken these for several months now-refilled rx and had an extensive discussion on the importance of compliance.  Ordered A1c - NOT COLLECTED, PCP TO FOLLOW CLOSELY continued sensitive SSI while in hospital.   Hypertension: Reports not taking lisinopril for several months.  Continue with lisinopril 10 mg daily. rx provided   Discharge Diagnoses:  Principal Problem:   E coli bacteremia Active Problems:   Diabetes mellitus without complication ([place])   Hypertension  associated with diabetes [facility])    Discharge Instructions  Discharge Instructions    Call MD for:  difficulty breathing, headache or visual disturbances   Complete by: As directed    Call MD for:  extreme fatigue   Complete by: As directed    Call MD for:  hives   Complete by: As directed    Call MD for:  persistant dizziness or light-headedness   Complete by: As directed    Call MD for:  persistant nausea and vomiting   Complete by: As directed    Call MD for:  severe uncontrolled pain   Complete by: As directed    Call MD for:  temperature >100.4   Complete by: As directed    Diet Carb Modified   Complete by: As directed    Increase activity slowly   Complete by: As directed      Allergies as of 03/21/2019   No Known Allergies     Medication List    STOP taking these medications   cephALEXin 500 MG capsule Commonly known as: KEFLEX     TAKE these medications   cefdinir 300 MG capsule Commonly known as: OMNICEF Take 1 capsule (300 mg total) by mouth 2 (two) times daily for 7 days.   Jardiance 25 MG Tabs tablet Generic drug: empagliflozin Take 25 mg by mouth daily before breakfast.   lisinopril 10 MG tablet Commonly known as: ZESTRIL Take 1 tablet (10 mg total) by mouth daily.   metFORMIN 500 MG tablet Commonly known as: Glucophage Take 1 tablet (500 mg total) by mouth 2 (two) times daily with a meal.       No Known Allergies  Consultations:  verbal consultation with ID  [email or web address]   Procedures/Studies: Dg Chest 2 View  Result Date: 03/18/2019 CLINICAL DATA:  Suspected sepsis.  Shortness of breath. EXAM: CHEST - 2 VIEW COMPARISON:  March 10, 2013 FINDINGS: The heart, hila, mediastinum, and pleura are normal. No focal infiltrates. Nodules or masses. IMPRESSION: No cause for fever identified. Electronically Signed   By: Gerome Sam III M.D   On: 03/18/2019 18:11   Ct Angio Chest Pe W And/or Wo Contrast  Result Date: 03/18/2019 CLINICAL DATA:  PE  suspected, high pretest prob. Fever. Shortness of breath. EXAM: CT ANGIOGRAPHY CHEST WITH CONTRAST TECHNIQUE: Multidetector CT imaging of the chest was performed using the standard protocol during bolus administration of intravenous contrast. Multiplanar CT image reconstructions and MIPs were obtained to evaluate the vascular anatomy. CONTRAST:  75mL OMNIPAQUE IOHEXOL 350 MG/ML SOLN COMPARISON:  Radiograph earlier this day. FINDINGS: Cardiovascular: There are no filling defects within the pulmonary arteries to suggest pulmonary embolus. The thoracic aorta is normal in caliber without dissection. Mild aortic atherosclerosis. Conventional branching pattern from the aortic arch. There are coronary artery calcifications. Upper normal heart size. No pericardial effusion. Mediastinum/Nodes: Small mediastinal and hilar nodes are not enlarged by size criteria. No visualized thyroid nodule. The esophagus is decompressed. Lungs/Pleura: Linear atelectasis or scarring in the lingula. No confluent airspace disease. Head mm perifissural nodule in the left lower lobe series 6, image 44. Tiny subpleural nodule in the right lower lobe, series 6, image 51. No pulmonary edema. No pleural fluid. Trachea and mainstem bronchi are patent. Upper Abdomen: Upper abdominal atherosclerosis. No acute findings. Suggestion of a hepatic steatosis. Musculoskeletal: Mild multilevel degenerative change in the spine. There are no acute or suspicious osseous abnormalities. Review of the MIP images confirms the above findings. IMPRESSION: 1. No pulmonary embolus or acute intrathoracic abnormality. No evidence of pneumonia. 2. Coronary artery calcifications. 3. Tiny bilateral subpleural and perifissural nodules, largest 5 mm in the left lower lobe. Suspect these are intrapulmonary lymph nodes, however recommend follow-up if patient is high-risk. No follow-up needed if patient is low-risk (and has no known or suspected primary neoplasm). Non-contrast  chest CT can be considered in 12 months if patient is high-risk. This recommendation follows the consensus statement: Guidelines for Management of Incidental Pulmonary Nodules Detected on CT Images: From the Fleischner Society 2017; Radiology 2017; 284:228-243. Aortic Atherosclerosis (ICD10-I70.0). Electronically Signed   By: Narda Rutherford M.D.   On: 03/18/2019 19:26       Subjective: Reports feeling well, no acute events Requested d/c home  Discharge Exam: Vitals:   03/20/19 2124 03/21/19 0556  BP: (!) 156/60 139/62  Pulse: 99 69  Resp:  20  Temp: 100 F (37.8 C) 99.4 F (37.4 C)  SpO2: 100% 100%   Vitals:   03/20/19 1629 03/20/19 1641 03/20/19 2124 03/21/19 0556  BP:  (!) 163/62 (!) 156/60 139/62  Pulse:  74 99 69  Resp:  17  20  Temp:  99.1 F (37.3 C) 100 F (37.8 C) 99.4 F (37.4 C)  TempSrc:  Oral Oral Oral  SpO2:  100% 100% 100%  Weight: 81.6 kg     Height:  (1.702 m)       General: Pt is alert, awake, not in acute distress Cardiovascular: RRR, S1/S2 +, no rubs, no gallops Respiratory: CTA bilaterally, no wheezing, no rhonchi Abdominal: Soft, NT, ND, bowel sounds + Extremities: no edema, no cyanosis    The results of significant diagnostics from this hospitalization (including imaging, microbiology, ancillary and laboratory)  are listed below for reference.     Microbiology: Recent Results (from the past 240 hour(s))  Culture, blood (Routine x 2)     Status: None (Preliminary result)   Collection Time: 03/18/19  5:40 PM   Specimen: BLOOD  Result Value Ref Range Status   Specimen Description BLOOD RIGHT ANTECUBITAL  Final   Special Requests   Final    BOTTLES DRAWN AEROBIC AND ANAEROBIC Blood Culture adequate volume   Culture   Final    NO GROWTH 2 DAYS Performed at [facility], [address], [place], [place] [address]    Report Status PENDING  Incomplete  Culture, blood (Routine x 2)     Status: Abnormal   Collection Time:  03/18/19  5:43 PM   Specimen: BLOOD  Result Value Ref Range Status   Specimen Description   Final    BLOOD LEFT ANTECUBITAL Performed at [facility], [address], [place], [place] [address]    Special Requests   Final    BOTTLES DRAWN AEROBIC AND ANAEROBIC Blood Culture adequate volume Performed at [facility], [address] [place], [place] [address]    Culture  Setup Time   Final    GRAM NEGATIVE RODS ANAEROBIC BOTTLE ONLY CRITICAL RESULT CALLED TO, READ BACK BY AND VERIFIED WITH: Sebastian Ache AT 6213 03/19/2019 SDR Performed at [facility], [address], [place], [place] [address]    Culture ESCHERICHIA COLI (A)  Final   Report Status 03/21/2019 FINAL  Final   Organism ID, Bacteria ESCHERICHIA COLI  Final      Susceptibility   Escherichia coli - MIC*    AMPICILLIN >=32 RESISTANT Resistant     CEFAZOLIN <=4 SENSITIVE Sensitive     CEFEPIME <=1 SENSITIVE Sensitive     CEFTAZIDIME <=1 SENSITIVE Sensitive     CEFTRIAXONE <=1 SENSITIVE Sensitive     CIPROFLOXACIN <=0.25 SENSITIVE Sensitive     GENTAMICIN <=1 SENSITIVE Sensitive     IMIPENEM <=0.25 SENSITIVE Sensitive     TRIMETH/SULFA <=20 SENSITIVE Sensitive     AMPICILLIN/SULBACTAM >=32 RESISTANT Resistant     PIP/TAZO <=4 SENSITIVE Sensitive     Extended ESBL NEGATIVE Sensitive     * ESCHERICHIA COLI  Blood Culture ID Panel (Reflexed)     Status: Abnormal   Collection Time: 03/18/19  5:43 PM  Result Value Ref Range Status   Enterococcus species NOT DETECTED NOT DETECTED Final   Listeria monocytogenes NOT DETECTED NOT DETECTED Final   Staphylococcus species NOT DETECTED NOT DETECTED Final   Staphylococcus aureus (BCID) NOT DETECTED NOT DETECTED Final   Streptococcus species NOT DETECTED NOT DETECTED Final   Streptococcus agalactiae NOT DETECTED NOT DETECTED Final   Streptococcus pneumoniae NOT DETECTED NOT DETECTED Final   Streptococcus pyogenes NOT DETECTED NOT DETECTED Final    Acinetobacter baumannii NOT DETECTED NOT DETECTED Final   Enterobacteriaceae species DETECTED (A) NOT DETECTED Final    Comment: Enterobacteriaceae represent a large family of gram-negative bacteria, not a single organism. CRITICAL RESULT CALLED TO, READ BACK BY AND VERIFIED WITH:  GREG MOYER AT 0720 03/19/2019 SDR    Enterobacter cloacae complex NOT DETECTED NOT DETECTED Final   Escherichia coli DETECTED (A) NOT DETECTED Final    Comment: CRITICAL RESULT CALLED TO, READ BACK BY AND VERIFIED WITH:  GREG MOYER AT 0720 03/19/2019 SDR    Klebsiella oxytoca NOT DETECTED NOT DETECTED Final   Klebsiella pneumoniae NOT DETECTED NOT DETECTED Final   Proteus  species NOT DETECTED NOT DETECTED Final   Serratia marcescens NOT DETECTED NOT DETECTED Final   Carbapenem resistance NOT DETECTED NOT DETECTED Final   Haemophilus influenzae NOT DETECTED NOT DETECTED Final   Neisseria meningitidis NOT DETECTED NOT DETECTED Final   Pseudomonas aeruginosa NOT DETECTED NOT DETECTED Final   Candida albicans NOT DETECTED NOT DETECTED Final   Candida glabrata NOT DETECTED NOT DETECTED Final   Candida krusei NOT DETECTED NOT DETECTED Final   Candida parapsilosis NOT DETECTED NOT DETECTED Final   Candida tropicalis NOT DETECTED NOT DETECTED Final    Comment: Performed at [facility], [address] [place], [place] [address]  SARS CORONAVIRUS 2 (TAT 6-24 HRS) Nasopharyngeal Nasopharyngeal Swab     Status: None   Collection Time: 03/18/19  6:24 PM   Specimen: Nasopharyngeal Swab  Result Value Ref Range Status   SARS Coronavirus 2 NEGATIVE NEGATIVE Final    Comment: (NOTE) SARS-CoV-2 target nucleic acids are NOT DETECTED. The SARS-CoV-2 RNA is generally detectable in upper and lower respiratory specimens during the acute phase of infection. Negative results do not preclude SARS-CoV-2 infection, do not rule out co-infections with other pathogens, and should not be used as the sole basis for treatment  or other patient management decisions. Negative results must be combined with clinical observations, patient history, and epidemiological information. The expected result is Negative. Fact Sheet for Patients: HairSlick.no Fact Sheet for Healthcare Providers: quierodirigir.com This test is not yet approved or cleared by the [place] FDA and  has been authorized for detection and/or diagnosis of SARS-CoV-2 by FDA under an Emergency Use Authorization (EUA). This EUA will remain  in effect (meaning this test can be used) for the duration of the COVID-19 declaration under Section 56 4(b)(1) of the Act, 21 U.S.C. section 360bbb-3(b)(1), unless the authorization is terminated or revoked sooner. Performed at [facility], [address], [place], [place] [address]   Blood culture (routine x 2)     Status: None (Preliminary result)   Collection Time: 03/19/19  8:59 PM   Specimen: BLOOD  Result Value Ref Range Status   Specimen Description BLOOD RIGHT ANTECUBITAL  Final   Special Requests   Final    BOTTLES DRAWN AEROBIC AND ANAEROBIC Blood Culture results may not be optimal due to an excessive volume of blood received in culture bottles   Culture   Final    NO GROWTH < 12 HOURS Performed at [facility], [address], [place], [place] [address]    Report Status PENDING  Incomplete  Blood culture (routine x 2)     Status: None (Preliminary result)   Collection Time: 03/19/19  9:01 PM   Specimen: BLOOD  Result Value Ref Range Status   Specimen Description BLOOD LEFT ANTECUBITAL  Final   Special Requests   Final    BOTTLES DRAWN AEROBIC AND ANAEROBIC Blood Culture results may not be optimal due to an excessive volume of blood received in culture bottles   Culture   Final    NO GROWTH < 12 HOURS Performed at [facility], [address], [place], [place] [address]    Report Status PENDING  Incomplete   Urine culture     Status: Abnormal   Collection Time: 03/19/19  9:22 PM   Specimen: Urine, Random  Result Value Ref Range Status   Specimen Description   Final    URINE, RANDOM Performed at [facility], [address] [place], [place] [address]  Special Requests   Final    Normal Performed at [facility], [address] [place], [place] [address]    Culture (A)  Final    <10,000 COLONIES/mL INSIGNIFICANT GROWTH Performed at [facility], [address] [place], [place] [address]    Report Status 03/21/2019 FINAL  Final     Labs: BNP (last 3 results) No results for input(s): BNP in the last 8760 hours. Basic Metabolic Panel: Recent Labs  Lab 03/18/19 1743 03/19/19 1532 03/20/19 1858  NA 137 133* 137  K 3.7 3.7 3.4*  CL 103 99 106  CO2 GLUCOSE 221* 234* 191*  BUN CREATININE 0.75 0.84 0.85  CALCIUM 9.8 9.2 8.9   Liver Function Tests: Recent Labs  Lab 03/18/19 1743 03/19/19 1532  AST 20 16  ALT 15 14  ALKPHOS 50 44  BILITOT 0.8 1.0  PROT 8.1 8.1  ALBUMIN 3.8 3.6   No results for input(s): LIPASE, AMYLASE in the last 168 hours. No results for input(s): AMMONIA in the last 168 hours. CBC: Recent Labs  Lab 03/18/19 1743 03/19/19 1532 03/20/19 1858  WBC 8.3 11.7* 8.9  NEUTROABS 6.4 8.6*  --   HGB 13.0 12.5 11.7*  HCT 37.3 35.7* 33.6*  MCV 87.8 88.4 88.2  PLT 222 196 172   Cardiac Enzymes: No results for input(s): CKTOTAL, CKMB, CKMBINDEX, TROPONINI in the last 168 hours. BNP: Invalid input(s): POCBNP CBG: Recent Labs  Lab 03/20/19 0801 03/20/19 1156 03/20/19 1637 03/20/19 2103 03/21/19 0740  GLUCAP 171* 176* 173* 199* 171*   D-Dimer No results for input(s): DDIMER in the last 72 hours. Hgb A1c Recent Labs    03/20/19 1858  HGBA1C 10.1*   Lipid Profile No results for input(s): CHOL, HDL, LDLCALC, TRIG, CHOLHDL, LDLDIRECT in the last 72 hours. Thyroid function studies No results for  input(s): TSH, T4TOTAL, T3FREE, THYROIDAB in the last 72 hours.  Invalid input(s): FREET3 Anemia work up No results for input(s): VITAMINB12, FOLATE, FERRITIN, TIBC, IRON, RETICCTPCT in the last 72 hours. Urinalysis    Component Value Date/Time   COLORURINE YELLOW (A) 03/18/2019 1952   APPEARANCEUR HAZY (A) 03/18/2019 1952   LABSPEC 1.021 03/18/2019 1952   PHURINE 7.0 03/18/2019 1952   GLUCOSEU >=500 (A) 03/18/2019 1952   HGBUR SMALL (A) 03/18/2019 1952   BILIRUBINUR NEGATIVE 03/18/2019 1952   KETONESUR NEGATIVE 03/18/2019 1952   PROTEINUR 30 (A) 03/18/2019 1952   NITRITE POSITIVE (A) 03/18/2019 1952   LEUKOCYTESUR SMALL (A) 03/18/2019 1952   Sepsis Labs Invalid input(s): PROCALCITONIN,  WBC,  LACTICIDVEN Microbiology Recent Results (from the past 240 hour(s))  Culture, blood (Routine x 2)     Status: None (Preliminary result)   Collection Time: 03/18/19  5:40 PM   Specimen: BLOOD  Result Value Ref Range Status   Specimen Description BLOOD RIGHT ANTECUBITAL  Final   Special Requests   Final    BOTTLES DRAWN AEROBIC AND ANAEROBIC Blood Culture adequate volume   Culture   Final    NO GROWTH 2 DAYS Performed at [facility], [address] [place], [place] [address]    Report Status PENDING  Incomplete  Culture, blood (Routine x 2)     Status: Abnormal   Collection Time: 03/18/19  5:43 PM   Specimen: BLOOD  Result Value Ref Range Status   Specimen Description   Final    BLOOD LEFT ANTECUBITAL Performed at [facility], [address] [place], [place] [address]    Special Requests   Final    BOTTLES DRAWN AEROBIC AND ANAEROBIC Blood Culture adequate volume Performed at [facility], [address] [place], [place] [address]    Culture  Setup Time   Final    GRAM NEGATIVE RODS ANAEROBIC BOTTLE ONLY CRITICAL RESULT CALLED TO, READ BACK BY AND VERIFIED WITH: Sebastian Ache AT 2774 03/19/2019 SDR Performed at [facility], [address] [place], [place] [address]    Culture ESCHERICHIA COLI (A)  Final   Report Status 03/21/2019 FINAL  Final   Organism ID, Bacteria ESCHERICHIA COLI  Final      Susceptibility   Escherichia coli - MIC*    AMPICILLIN >=32 RESISTANT Resistant     CEFAZOLIN <=4 SENSITIVE Sensitive     CEFEPIME <=1 SENSITIVE Sensitive     CEFTAZIDIME <=1 SENSITIVE Sensitive     CEFTRIAXONE <=1 SENSITIVE Sensitive     CIPROFLOXACIN <=0.25 SENSITIVE Sensitive     GENTAMICIN <=1 SENSITIVE Sensitive     IMIPENEM <=0.25 SENSITIVE Sensitive     TRIMETH/SULFA <=20 SENSITIVE Sensitive     AMPICILLIN/SULBACTAM >=32 RESISTANT Resistant     PIP/TAZO <=4 SENSITIVE Sensitive     Extended ESBL NEGATIVE Sensitive     * ESCHERICHIA COLI  Blood Culture ID Panel (Reflexed)     Status: Abnormal   Collection Time: 03/18/19  5:43 PM  Result Value Ref Range Status   Enterococcus species NOT DETECTED NOT DETECTED Final   Listeria monocytogenes NOT DETECTED NOT DETECTED Final   Staphylococcus species NOT DETECTED NOT DETECTED Final   Staphylococcus aureus (BCID) NOT DETECTED NOT DETECTED Final   Streptococcus species NOT DETECTED NOT DETECTED Final   Streptococcus agalactiae NOT DETECTED NOT DETECTED Final   Streptococcus pneumoniae NOT DETECTED NOT DETECTED Final   Streptococcus pyogenes NOT DETECTED NOT DETECTED Final   Acinetobacter baumannii NOT DETECTED NOT DETECTED Final   Enterobacteriaceae species DETECTED (A) NOT DETECTED Final    Comment: Enterobacteriaceae represent a large family of gram-negative bacteria, not a single organism. CRITICAL RESULT CALLED TO, READ BACK BY AND VERIFIED WITH:  GREG MOYER AT 0720 03/19/2019 SDR    Enterobacter cloacae complex NOT DETECTED NOT DETECTED Final   Escherichia coli DETECTED (A) NOT DETECTED Final    Comment: CRITICAL RESULT CALLED TO, READ BACK BY AND VERIFIED WITH:  GREG MOYER AT 0720 03/19/2019 SDR    Klebsiella oxytoca NOT DETECTED NOT DETECTED Final   Klebsiella  pneumoniae NOT DETECTED NOT DETECTED Final   Proteus species NOT DETECTED NOT DETECTED Final   Serratia marcescens NOT DETECTED NOT DETECTED Final   Carbapenem resistance NOT DETECTED NOT DETECTED Final   Haemophilus influenzae NOT DETECTED NOT DETECTED Final   Neisseria meningitidis NOT DETECTED NOT DETECTED Final   Pseudomonas aeruginosa NOT DETECTED NOT DETECTED Final   Candida albicans NOT DETECTED NOT DETECTED Final   Candida glabrata NOT DETECTED NOT DETECTED Final   Candida krusei NOT DETECTED NOT DETECTED Final   Candida parapsilosis NOT DETECTED NOT DETECTED Final   Candida tropicalis NOT DETECTED NOT DETECTED Final    Comment: Performed at [facility], [address] [place], [place] [address]  SARS CORONAVIRUS 2 (TAT 6-24 HRS) Nasopharyngeal Nasopharyngeal Swab     Status: None   Collection Time: 03/18/19  6:24 PM   Specimen: Nasopharyngeal Swab  Result Value Ref Range Status   SARS Coronavirus 2 NEGATIVE NEGATIVE Final    Comment: (NOTE)  SARS-CoV-2 target nucleic acids are NOT DETECTED. The SARS-CoV-2 RNA is generally detectable in upper and lower respiratory specimens during the acute phase of infection. Negative results do not preclude SARS-CoV-2 infection, do not rule out co-infections with other pathogens, and should not be used as the sole basis for treatment or other patient management decisions. Negative results must be combined with clinical observations, patient history, and epidemiological information. The expected result is Negative. Fact Sheet for Patients: HairSlick.nohttps://www.fda.gov/media/138098/download Fact Sheet for Healthcare Providers: quierodirigir.comhttps://www.fda.gov/media/138095/download This test is not yet approved or cleared by the [place] FDA and  has been authorized for detection and/or diagnosis of SARS-CoV-2 by FDA under an Emergency Use Authorization (EUA). This EUA will remain  in effect (meaning this test can be used) for the duration of  the COVID-19 declaration under Section 56 4(b)(1) of the Act, 21 U.S.C. section 360bbb-3(b)(1), unless the authorization is terminated or revoked sooner. Performed at [facility], [address] [place], [place] [address]   Blood culture (routine x 2)     Status: None (Preliminary result)   Collection Time: 03/19/19  8:59 PM   Specimen: BLOOD  Result Value Ref Range Status   Specimen Description BLOOD RIGHT ANTECUBITAL  Final   Special Requests   Final    BOTTLES DRAWN AEROBIC AND ANAEROBIC Blood Culture results may not be optimal due to an excessive volume of blood received in culture bottles   Culture   Final    NO GROWTH < 12 HOURS Performed at [facility], [address] [place], [place] [address]    Report Status PENDING  Incomplete  Blood culture (routine x 2)     Status: None (Preliminary result)   Collection Time: 03/19/19  9:01 PM   Specimen: BLOOD  Result Value Ref Range Status   Specimen Description BLOOD LEFT ANTECUBITAL  Final   Special Requests   Final    BOTTLES DRAWN AEROBIC AND ANAEROBIC Blood Culture results may not be optimal due to an excessive volume of blood received in culture bottles   Culture   Final    NO GROWTH < 12 HOURS Performed at [facility], [address] [place], [place] [address]    Report Status PENDING  Incomplete  Urine culture     Status: Abnormal   Collection Time: 03/19/19  9:22 PM   Specimen: Urine, Random  Result Value Ref Range Status   Specimen Description   Final    URINE, RANDOM Performed at [facility], [address] [place], [place] [address]    Special Requests   Final    Normal Performed at [facility], [address] [place], [place] [address]    Culture (A)  Final    <10,000 COLONIES/mL INSIGNIFICANT GROWTH Performed at [facility], [address] [place], [place] [address]    Report Status 03/21/2019 FINAL  Final     Time coordinating discharge:  Over 30 minutes  SIGNED:   Burke Keelshristopher Zakariya Knickerbocker, MD  [facility] 03/21/2019, 10:02 AM Pager   If 7PM-7AM, please contact night-coverage [email or web address] Password [email or web address]

## 2019-03-21 NOTE — Progress Notes (Signed)
*  PRELIMINARY RESULTS* Echocardiogram 2D Echocardiogram has been performed.  Ashley Ellison 03/21/2019, 12:03 PM

## 2019-03-21 NOTE — Progress Notes (Signed)
Discharge instructions reviewed with patient. Patient verbalizes understanding. All questions answered. 2 IVs removed. No complications. Pt escorted out via wheelchair.

## 2019-03-23 LAB — CULTURE, BLOOD (ROUTINE X 2)
Culture: NO GROWTH
Special Requests: ADEQUATE

## 2019-03-24 LAB — CULTURE, BLOOD (ROUTINE X 2)
Culture: NO GROWTH
Culture: NO GROWTH

## 2019-10-15 ENCOUNTER — Encounter: Payer: Self-pay | Admitting: Emergency Medicine

## 2019-10-15 ENCOUNTER — Other Ambulatory Visit: Payer: Self-pay

## 2019-10-15 ENCOUNTER — Emergency Department: Payer: Medicare Other

## 2019-10-15 ENCOUNTER — Emergency Department
Admission: EM | Admit: 2019-10-15 | Discharge: 2019-10-15 | Disposition: A | Discharge status: Home / Self Care | Payer: Medicare Other | Attending: Emergency Medicine | Admitting: Emergency Medicine

## 2019-10-15 DIAGNOSIS — M25562 Pain in left knee: Secondary | ICD-10-CM | POA: Diagnosis present

## 2019-10-15 DIAGNOSIS — E119 Type 2 diabetes mellitus without complications: Secondary | ICD-10-CM | POA: Diagnosis not present

## 2019-10-15 DIAGNOSIS — Z7984 Long term (current) use of oral hypoglycemic drugs: Secondary | ICD-10-CM | POA: Diagnosis not present

## 2019-10-15 DIAGNOSIS — M7122 Synovial cyst of popliteal space [Baker], left knee: Secondary | ICD-10-CM | POA: Insufficient documentation

## 2019-10-15 DIAGNOSIS — I10 Essential (primary) hypertension: Secondary | ICD-10-CM | POA: Diagnosis not present

## 2019-10-15 MED ORDER — NAPROXEN 500 MG PO TABS: 500.0000 mg | ORAL_TABLET | Freq: Two times a day (BID) | ORAL | 2 refills | Status: DC

## 2019-10-15 NOTE — ED Provider Notes (Signed)
[facility] Emergency Department Provider Note   ____________________________________________    I have reviewed the triage vital signs and the nursing notes.   HISTORY  Chief Complaint Leg Pain     HPI Ashley Ellison is a 69 y.o. female with history of diabetes, hypertension who presents with complaints of left leg pain. Patient reports burning intermittent sensation for quite some time in the left leg primarily on the medial aspect. She does have a history of diabetes but does not have the symptoms in the right leg. Sent from [employer] [facility] for evaluation for blood clot. Denies calf swelling. No history of blood clots.  Past Medical History:  Diagnosis Date  . Diabetes mellitus without complication (HCC)   . Hypertension     Patient Active Problem List   Diagnosis Date Noted  . E coli bacteremia 03/19/2019  . Diabetes mellitus without complication (HCC)   . Hypertension associated with diabetes [facility])     Past Surgical History:  Procedure Laterality Date  . BACK SURGERY    . VAGINAL DELIVERY      Prior to Admission medications   Medication Sig Start Date End Date Taking? Authorizing Provider  lisinopril (ZESTRIL) 10 MG tablet Take 1 tablet (10 mg total) by mouth daily. 03/21/19 04/20/19  Marzetta Board, MD  metFORMIN (GLUCOPHAGE) 500 MG tablet Take 1 tablet (500 mg total) by mouth 2 (two) times daily with a meal. 03/21/19 03/20/20  Spongberg, Susy Frizzle, MD  naproxen (NAPROSYN) 500 MG tablet Take 1 tablet (500 mg total) by mouth 2 (two) times daily with a meal. 10/15/19   Jene Every, MD     Allergies Patient has no known allergies.  No family history on file.  Social History Social History   Tobacco Use  . Smoking status: Never Smoker  . Smokeless tobacco: Never Used  Substance Use Topics  . Alcohol use: No  . Drug use: Not on file    Review of Systems  Constitutional: No fever/chills Eyes: No visual  changes.  ENT: No sore throat. Cardiovascular: Denies chest pain. Respiratory: Denies shortness of breath. Gastrointestinal: No abdominal pain.   Genitourinary: Negative for dysuria. Musculoskeletal: As above Skin: Negative for rash. Neurological: Negative for headaches    ____________________________________________   PHYSICAL EXAM:  VITAL SIGNS: ED Triage Vitals  Enc Vitals Group     BP 10/15/19 1103 (!) 147/94     Pulse Rate 10/15/19 1103 72     Resp 10/15/19 1103 14     Temp 10/15/19 1103 99 F (37.2 C)     Temp Source 10/15/19 1103 Oral     SpO2 10/15/19 1103 99 %     Weight 10/15/19 1104 84.8 kg (187 lb)     Height 10/15/19 1104 1.702 m (5\' 7" )     Head Circumference --      Peak Flow --      Pain Score 10/15/19 1103 9     Pain Loc --      Pain Edu? --      Excl. in GC? --     Constitutional: Alert and oriented.  Nose: No congestion/rhinnorhea. Mouth/Throat: Mucous membranes are moist.    Cardiovascular: Normal rate, regular rhythm.   Good peripheral circulation. Respiratory: Normal respiratory effort.  No retractions.    Musculoskeletal: No lower extremity tenderness nor edema.  Warm and well perfused, 2+ DP pulses, no calf swelling Neurologic:  Normal speech and language. No gross focal neurologic deficits are  appreciated.  Skin:  Skin is warm, dry and intact. No rash noted. Psychiatric: Mood and affect are normal. Speech and behavior are normal.  ____________________________________________   LABS (all labs ordered are listed, but only abnormal results are displayed)  Labs Reviewed - No data to display ____________________________________________  EKG  None ____________________________________________  RADIOLOGY  Ultrasound negative for DVT, left-sided Baker's cyst noted ____________________________________________   PROCEDURES  Procedure(s) performed: No  Procedures   Critical Care performed:  No ____________________________________________   INITIAL IMPRESSION / ASSESSMENT AND PLAN / ED COURSE  Pertinent labs & imaging results that were available during my care of the patient were reviewed by me and considered in my medical decision making (see chart for details).  Patient presents with left leg discomfort/burning with varying degrees of discomfort over the last several weeks. Suspicious for possible nephropathy, less likely DVT. No evidence of infection or arterial ischemia.  Ultrasound negative for DVT but is positive for Baker's cyst, this may play a role in her symptoms, will refer to orthopedics. Start on anti-inflammatory    ____________________________________________   FINAL CLINICAL IMPRESSION(S) / ED DIAGNOSES  Final diagnoses:  Baker's cyst of knee, left        Note:  This document was prepared using Dragon voice recognition software and may include unintentional dictation errors.   Lavonia Drafts, MD 10/15/19 1439

## 2019-10-15 NOTE — ED Triage Notes (Signed)
Pain in left leg-calf to ankle started weeks ago.  Worse 3-4 days burning pain.  Went to [facility] and they sent her here to check for blood clot.

## 2019-10-15 NOTE — ED Notes (Signed)
Pt presents to ED via POV with c/o leg leg burning, pt states is normally at night however has progressively worsening. Pt states has attemtped OTC measures at home with icy hot and bengay without relief. Pt is able to walk on it without difficulty. Pt states pain not relieved with rest or made worse with movement. Pt A&O x4.

## 2021-02-17 ENCOUNTER — Emergency Department: Payer: Medicare Other

## 2021-02-17 ENCOUNTER — Inpatient Hospital Stay: Payer: Medicare Other

## 2021-02-17 ENCOUNTER — Inpatient Hospital Stay
Admission: EM | Admit: 2021-02-17 | Discharge: 2021-02-22 | DRG: 253 | Disposition: A | Discharge status: Home Health | Payer: Medicare Other | Attending: Internal Medicine | Admitting: Internal Medicine

## 2021-02-17 ENCOUNTER — Other Ambulatory Visit: Payer: Self-pay

## 2021-02-17 DIAGNOSIS — E876 Hypokalemia: Secondary | ICD-10-CM | POA: Diagnosis present

## 2021-02-17 DIAGNOSIS — E11621 Type 2 diabetes mellitus with foot ulcer: Secondary | ICD-10-CM | POA: Diagnosis present

## 2021-02-17 DIAGNOSIS — Z7984 Long term (current) use of oral hypoglycemic drugs: Secondary | ICD-10-CM

## 2021-02-17 DIAGNOSIS — E1169 Type 2 diabetes mellitus with other specified complication: Secondary | ICD-10-CM | POA: Diagnosis present

## 2021-02-17 DIAGNOSIS — E08621 Diabetes mellitus due to underlying condition with foot ulcer: Principal | ICD-10-CM

## 2021-02-17 DIAGNOSIS — E1165 Type 2 diabetes mellitus with hyperglycemia: Secondary | ICD-10-CM | POA: Diagnosis present

## 2021-02-17 DIAGNOSIS — I10 Essential (primary) hypertension: Secondary | ICD-10-CM | POA: Diagnosis present

## 2021-02-17 DIAGNOSIS — Z23 Encounter for immunization: Secondary | ICD-10-CM | POA: Diagnosis present

## 2021-02-17 DIAGNOSIS — L03031 Cellulitis of right toe: Secondary | ICD-10-CM | POA: Diagnosis present

## 2021-02-17 DIAGNOSIS — I70235 Atherosclerosis of native arteries of right leg with ulceration of other part of foot: Secondary | ICD-10-CM | POA: Diagnosis not present

## 2021-02-17 DIAGNOSIS — L97521 Non-pressure chronic ulcer of other part of left foot limited to breakdown of skin: Secondary | ICD-10-CM

## 2021-02-17 DIAGNOSIS — E785 Hyperlipidemia, unspecified: Secondary | ICD-10-CM | POA: Diagnosis present

## 2021-02-17 DIAGNOSIS — I739 Peripheral vascular disease, unspecified: Secondary | ICD-10-CM

## 2021-02-17 DIAGNOSIS — N179 Acute kidney failure, unspecified: Secondary | ICD-10-CM | POA: Diagnosis present

## 2021-02-17 DIAGNOSIS — E860 Dehydration: Secondary | ICD-10-CM | POA: Diagnosis present

## 2021-02-17 DIAGNOSIS — Z79899 Other long term (current) drug therapy: Secondary | ICD-10-CM

## 2021-02-17 DIAGNOSIS — I152 Hypertension secondary to endocrine disorders: Secondary | ICD-10-CM | POA: Diagnosis present

## 2021-02-17 DIAGNOSIS — L97509 Non-pressure chronic ulcer of other part of unspecified foot with unspecified severity: Secondary | ICD-10-CM | POA: Diagnosis present

## 2021-02-17 DIAGNOSIS — Z8249 Family history of ischemic heart disease and other diseases of the circulatory system: Secondary | ICD-10-CM | POA: Diagnosis not present

## 2021-02-17 DIAGNOSIS — M7989 Other specified soft tissue disorders: Secondary | ICD-10-CM | POA: Diagnosis present

## 2021-02-17 DIAGNOSIS — I96 Gangrene, not elsewhere classified: Secondary | ICD-10-CM | POA: Diagnosis not present

## 2021-02-17 DIAGNOSIS — E1152 Type 2 diabetes mellitus with diabetic peripheral angiopathy with gangrene: Secondary | ICD-10-CM | POA: Diagnosis present

## 2021-02-17 DIAGNOSIS — E1159 Type 2 diabetes mellitus with other circulatory complications: Secondary | ICD-10-CM

## 2021-02-17 DIAGNOSIS — Z20822 Contact with and (suspected) exposure to covid-19: Secondary | ICD-10-CM | POA: Diagnosis present

## 2021-02-17 DIAGNOSIS — L97519 Non-pressure chronic ulcer of other part of right foot with unspecified severity: Secondary | ICD-10-CM | POA: Diagnosis present

## 2021-02-17 DIAGNOSIS — Z87891 Personal history of nicotine dependence: Secondary | ICD-10-CM | POA: Diagnosis not present

## 2021-02-17 DIAGNOSIS — L97529 Non-pressure chronic ulcer of other part of left foot with unspecified severity: Secondary | ICD-10-CM | POA: Diagnosis not present

## 2021-02-17 LAB — CBC
HCT: 34 % — ABNORMAL LOW (ref 36.0–46.0)
Hemoglobin: 12.1 g/dL (ref 12.0–15.0)
MCH: 32.9 pg (ref 26.0–34.0)
MCHC: 35.6 g/dL (ref 30.0–36.0)
MCV: 92.4 fL (ref 80.0–100.0)
Platelets: 268 10*3/uL (ref 150–400)
RBC: 3.68 MIL/uL — ABNORMAL LOW (ref 3.87–5.11)
RDW: 11.5 % (ref 11.5–15.5)
WBC: 7.2 10*3/uL (ref 4.0–10.5)
nRBC: 0 % (ref 0.0–0.2)

## 2021-02-17 LAB — CBG MONITORING, ED: Glucose-Capillary: 195 mg/dL — ABNORMAL HIGH (ref 70–99)

## 2021-02-17 LAB — BASIC METABOLIC PANEL
Anion gap: 8 (ref 5–15)
BUN: 14 mg/dL (ref 8–23)
CO2: 27 mmol/L (ref 22–32)
Calcium: 9.3 mg/dL (ref 8.9–10.3)
Chloride: 101 mmol/L (ref 98–111)
Creatinine, Ser: 1.27 mg/dL — ABNORMAL HIGH (ref 0.44–1.00)
GFR, Estimated: 45 mL/min — ABNORMAL LOW (ref >60)
Glucose, Bld: 301 mg/dL — ABNORMAL HIGH (ref 70–99)
Potassium: 3.6 mmol/L (ref 3.5–5.1)
Sodium: 136 mmol/L (ref 135–145)

## 2021-02-17 LAB — LIPID PANEL
Cholesterol: 195 mg/dL (ref 0–200)
HDL: 54 mg/dL (ref >40)
LDL Cholesterol: 112 mg/dL — ABNORMAL HIGH (ref 0–99)
Total CHOL/HDL Ratio: 3.6 RATIO
Triglycerides: 143 mg/dL (ref <150)
VLDL: 29 mg/dL (ref 0–40)

## 2021-02-17 LAB — SEDIMENTATION RATE: Sed Rate: 77 mm/hr — ABNORMAL HIGH (ref 0–30)

## 2021-02-17 MED ORDER — SODIUM CHLORIDE 0.9 % IV SOLN
2.0000 g | Freq: Once | INTRAVENOUS | Status: AC
  Administered 2021-02-17: 2 g via INTRAVENOUS
  Filled 2021-02-17: qty 20

## 2021-02-17 MED ORDER — SODIUM CHLORIDE 0.9 % IV SOLN
2.0000 g | INTRAVENOUS | Status: DC
  Administered 2021-02-18 – 2021-02-21 (×4): 2 g via INTRAVENOUS
  Filled 2021-02-17: qty 20
  Filled 2021-02-17 (×3): qty 2
  Filled 2021-02-17: qty 20

## 2021-02-17 MED ORDER — INSULIN ASPART 100 UNIT/ML IJ SOLN
4.0000 [IU] | Freq: Once | INTRAMUSCULAR | Status: AC
  Administered 2021-02-17: 4 [IU] via INTRAVENOUS
  Filled 2021-02-17: qty 1

## 2021-02-17 MED ORDER — ACETAMINOPHEN 325 MG PO TABS
650.0000 mg | ORAL_TABLET | Freq: Four times a day (QID) | ORAL | Status: DC | PRN
  Administered 2021-02-18 – 2021-02-20 (×2): 650 mg via ORAL
  Filled 2021-02-17 (×2): qty 2

## 2021-02-17 MED ORDER — HEPARIN SODIUM (PORCINE) 5000 UNIT/ML IJ SOLN
5000.0000 [IU] | Freq: Three times a day (TID) | INTRAMUSCULAR | Status: DC
  Administered 2021-02-17 – 2021-02-22 (×12): 5000 [IU] via SUBCUTANEOUS
  Filled 2021-02-17 (×13): qty 1

## 2021-02-17 MED ORDER — HYDRALAZINE HCL 25 MG PO TABS
25.0000 mg | ORAL_TABLET | Freq: Three times a day (TID) | ORAL | Status: DC
  Administered 2021-02-17 – 2021-02-22 (×13): 25 mg via ORAL
  Filled 2021-02-17 (×13): qty 1

## 2021-02-17 MED ORDER — VANCOMYCIN HCL IN DEXTROSE 1-5 GM/200ML-% IV SOLN
1000.0000 mg | Freq: Once | INTRAVENOUS | Status: AC
  Administered 2021-02-17: 1000 mg via INTRAVENOUS
  Filled 2021-02-17: qty 200

## 2021-02-17 MED ORDER — SODIUM CHLORIDE 0.9 % IV SOLN: Freq: Once | INTRAVENOUS | Status: AC

## 2021-02-17 MED ORDER — SODIUM CHLORIDE 0.9 % IV SOLN: INTRAVENOUS | Status: AC

## 2021-02-17 MED ORDER — ACETAMINOPHEN 650 MG RE SUPP
650.0000 mg | Freq: Four times a day (QID) | RECTAL | Status: DC | PRN
  Filled 2021-02-17: qty 1

## 2021-02-17 MED ORDER — VANCOMYCIN VARIABLE DOSE PER UNSTABLE RENAL FUNCTION (PHARMACIST DOSING): Status: DC

## 2021-02-17 MED ORDER — INSULIN ASPART 100 UNIT/ML IJ SOLN
0.0000 [IU] | Freq: Three times a day (TID) | INTRAMUSCULAR | Status: DC
  Administered 2021-02-18: 3 [IU] via SUBCUTANEOUS
  Administered 2021-02-18 (×2): 2 [IU] via SUBCUTANEOUS
  Administered 2021-02-19 (×2): 3 [IU] via SUBCUTANEOUS
  Administered 2021-02-19: 2 [IU] via SUBCUTANEOUS
  Administered 2021-02-20 – 2021-02-21 (×4): 3 [IU] via SUBCUTANEOUS
  Administered 2021-02-22: 2 [IU] via SUBCUTANEOUS
  Filled 2021-02-17 (×9): qty 1

## 2021-02-17 MED ORDER — SODIUM CHLORIDE 0.9% FLUSH
3.0000 mL | Freq: Two times a day (BID) | INTRAVENOUS | Status: DC
  Administered 2021-02-18 – 2021-02-22 (×8): 3 mL via INTRAVENOUS

## 2021-02-17 NOTE — H&P (Signed)
History and Physical    Ashley Ellison GQQ:[id number] DOB: 08/14/1950 DOA: 02/17/2021  PCP: [name], MD    Patient coming from:  Home    Chief Complaint:  Right foot and leg pain.   HPI: Ashley Ellison is a 70 y.o. female seen in ed with complaints of rt 5 th MTP. Diabetic foot infection and cellulitis- rt fifth toe/ swelling and erythema, Ulcer x few months.  Pt dropped a cinder block on her foot  3 months ago and when her daughter came today to see it  They came here.redness and pain since few months, pt could not see the bottom of her foot,.  Pt states she could walk on it,. Swelling about a week ago. Pt states during covid she ran out and does not know how to order things online. Pt does not see podiatrist. Pt out of her diabetes medication over two years.   Pt has past medical history of DM II, Htn, h/o tobacco abuse.  ED Course:  Vitals:   02/17/21 1433 02/17/21 1434 02/17/21 1435 02/17/21 1804  BP:   (!) 194/75 (!) 192/77  Pulse: (!) 101   91  Resp: 18   17  Temp: 98.5 F (36.9 C)     TempSrc: Oral     SpO2: 99%   100%  Weight:  81.6 kg    Height:  5\' 7"  (1.702 m)    In ed pt is alert, and oriented and give history.  Labs show creatinine 1.27 with AKI. Cbc shows normal wbc count and hb/ platelet. In the emergency room patient received vancomycin, we will add Rocephin.  Review of Systems:  Review of Systems  Constitutional: Negative.   HENT: Negative.    Eyes: Negative.   Respiratory: Negative.    Cardiovascular: Negative.   Gastrointestinal: Negative.   Musculoskeletal:  Positive for joint pain.  Skin:  Positive for rash.  Neurological: Negative.     Past Medical History:  Diagnosis Date   Diabetes mellitus without complication (HCC)    Hypertension     Past Surgical History:  Procedure Laterality Date   BACK SURGERY     VAGINAL DELIVERY       reports that she has never smoked. She has never used smokeless tobacco. She  reports that she does not drink alcohol. No history on file for drug use.  No Known Allergies  Family History  Problem Relation Age of Onset   Liver disease Mother     Prior to Admission medications   Medication Sig Start Date End Date Taking? Authorizing Provider  lisinopril (ZESTRIL) 10 MG tablet Take 1 tablet (10 mg total) by mouth daily. 03/21/19 04/20/19  04/22/19, MD  metFORMIN (GLUCOPHAGE) 500 MG tablet Take 1 tablet (500 mg total) by mouth 2 (two) times daily with a meal. 03/21/19 03/20/20  Spongberg, 03/22/20, MD  naproxen (NAPROSYN) 500 MG tablet Take 1 tablet (500 mg total) by mouth 2 (two) times daily with a meal. 10/15/19   12/15/19, MD    Physical Exam: Vitals:   02/17/21 1433 02/17/21 1434 02/17/21 1435 02/17/21 1804  BP:   (!) 194/75 (!) 192/77  Pulse: (!) 101   91  Resp: 18   17  Temp: 98.5 F (36.9 C)     TempSrc: Oral     SpO2: 99%   100%  Weight:  81.6 kg    Height:  5\' 7"  (1.702 m)     Physical Exam  Vitals and nursing note reviewed.  Constitutional:      Appearance: Normal appearance. She is not ill-appearing.  HENT:     Head: Normocephalic and atraumatic.     Right Ear: External ear normal.     Left Ear: External ear normal.     Nose: Nose normal.     Mouth/Throat:     Mouth: Mucous membranes are dry.  Eyes:     Extraocular Movements: Extraocular movements intact.     Pupils: Pupils are equal, round, and reactive to light.  Neck:     Vascular: No carotid bruit.  Cardiovascular:     Rate and Rhythm: Normal rate and regular rhythm.     Pulses: Normal pulses.     Heart sounds: Normal heart sounds.  Pulmonary:     Effort: Pulmonary effort is normal.     Breath sounds: Normal breath sounds.  Abdominal:     General: Bowel sounds are normal. There is no distension.     Palpations: Abdomen is soft. There is no mass.     Tenderness: There is no abdominal tenderness. There is no guarding.     Hernia: No hernia is present.   Musculoskeletal:     Right lower leg: No edema.     Left lower leg: No edema.       Feet:  Skin:    General: Skin is warm.  Neurological:     General: No focal deficit present.     Mental Status: She is alert and oriented to person, place, and time.  Psychiatric:        Mood and Affect: Mood normal.        Behavior: Behavior normal.   Labs on Admission: I have personally reviewed following labs and imaging studies  No results for input(s): CKTOTAL, CKMB, TROPONINI in the last 72 hours. Lab Results  Component Value Date   WBC 7.2 02/17/2021   HGB 12.1 02/17/2021   HCT 34.0 (L) 02/17/2021   MCV 92.4 02/17/2021   PLT 268 02/17/2021    Recent Labs  Lab 02/17/21 1436  NA 136  K 3.6  CL 101  CO2 27  BUN 14  CREATININE 1.27*  CALCIUM 9.3  GLUCOSE 301*    COVID-19 Labs No results for input(s): DDIMER, FERRITIN, LDH, CRP in the last 72 hours. Lab Results  Component Value Date   SARSCOV2NAA NEGATIVE 03/18/2019    Radiological Exams on Admission: DG Foot Complete Right  Result Date: 02/17/2021 CLINICAL DATA:  Foot wound, swelling EXAM: RIGHT FOOT COMPLETE - 3+ VIEW COMPARISON:  None. FINDINGS: No acute bony abnormality. Specifically, no fracture, subluxation, or dislocation. No bone destruction to suggest osteomyelitis. Joint spaces maintained. Diffuse vascular calcifications. IMPRESSION: No acute bony abnormality. Electronically Signed   By: Charlett Nose M.D.   On: 02/17/2021 18:40    EKG: Independently reviewed.  None  Assessment/Plan Principal Problem:   Diabetic foot ulcers (HCC) Active Problems:   Diabetic foot ulcer (HCC)   AKI (acute kidney injury) (HCC)   Hypertension associated with diabetes (HCC)   Hyperlipidemia associated with type 2 diabetes mellitus (HCC)   Diabetic foot ulcer: We will continue patient on current IV antibiotic regimen initiated in the ED with vancomycin and rocephin.  Will admit patient to medical unit with telemetry for continuous  cardiac monitoring. Will consult wound care team obtain an MRI right foot to identify osteomyelitis. As needed pain medication and Tylenol. General surgery/podiatry consult per a.m. team results. We will currently hold  patient's metformin and start patient on sliding scale regimen.  Acute kidney injury, Attribute to combination of medications and dehydration. Patient's lisinopril and metformin.  Stop patient's naproxen. Continue to monitor patient overnight continue with maintenance IV fluids overnight. We will hold patient's nephrotoxic meds and renally dose all needed antibiotics.   Hypertension: Blood pressure (!) 192/77, pulse 91, temperature 98.5 F (36.9 C), temperature source Oral, resp. rate 17, height 5\' 7"  (1.702 m), weight 81.6 kg, SpO2 100 %. We will hold patient's lisinopril.  And will start patient on scheduled hydralazine.  Hyperlipidemia: Will obtain an a.m. lipid panel and start patient on statin therapy.  DVT prophylaxis:  Heparin      Code Status:  Full code    Family Communication:  (Daughter)  [phone] (Home Phone)   Disposition Plan:  Home    Consults called:  None   Admission status: Inpatient    [phone] MD [facility] [phone] How to contact the [facility] Attending or Consulting provider 7A - 7P or covering provider during after hours 7P -7A, for this patient.    Check the care team in [facility] and look for a) attending/consulting [facility] provider listed and b) the [facility] team listed Log into [email or web address] and use [facility] universal password to access. If you do not have the password, please contact the hospital operator. Locate the [facility] provider you are looking for under [facility] and page to a number that you can be directly reached. If you still have difficulty reaching the provider, please page the [facility] (Director on Call) for the Hospitalists listed on amion for assistance. [email or web address] Password  [email or web address] 02/17/2021, 9:11 PM

## 2021-02-17 NOTE — ED Notes (Signed)
Isaacs MD made aware of pts blood pressure 192/77

## 2021-02-17 NOTE — ED Notes (Signed)
Lactic sent if needed ?

## 2021-02-17 NOTE — ED Provider Notes (Addendum)
[facility] Emergency Department Provider Note  ____________________________________________   Event Date/Time   First MD Initiated Contact with Patient 02/17/21 1751     (approximate)  I have reviewed the triage vital signs and the nursing notes.   HISTORY  Chief Complaint Foot Pain    HPI Ashley Ellison is a 70 y.o. female with history of hypertension, diabetes, not currently treated, here with right foot pain.  The patient states she has had a wound on her right small toe for the last several months.  She states it has been intermittently painful.  Over the last week, it has been more painful and has had more swelling.  Her daughter came to check on it and sent her here for further evaluation.  She does notice had a small smell to it.  Reports aching, throbbing pain, worse with movement.  She has not seen a doctor for this.  She has not had her metformin in over a year.  She states that her refills ran out and she has not had time to see her doctor.  She does not know what her blood sugars have been.  Denies any known fevers.    Past Medical History:  Diagnosis Date   Diabetes mellitus without complication ([place])    Hypertension     Patient Active Problem List   Diagnosis Date Noted   Diabetic foot ulcers ([place]) 02/17/2021   AKI (acute kidney injury) ([place]) 02/17/2021   Diabetic foot ulcer ([place]) 02/17/2021   E coli bacteremia 03/19/2019   Diabetes mellitus without complication ([place])    Hypertension associated with diabetes ([place])    Hyperlipidemia associated with type 2 diabetes mellitus ([place]) 11/20/2013    Past Surgical History:  Procedure Laterality Date   BACK SURGERY     VAGINAL DELIVERY      Prior to Admission medications   Medication Sig Start Date End Date Taking? Authorizing Provider  lisinopril (ZESTRIL) 10 MG tablet Take 1 tablet (10 mg total) by mouth daily. 03/21/19 04/20/19  Marcell Anger, MD  metFORMIN  (GLUCOPHAGE) 500 MG tablet Take 1 tablet (500 mg total) by mouth 2 (two) times daily with a meal. 03/21/19 03/20/20  Spongberg, Audie Pinto, MD  naproxen (NAPROSYN) 500 MG tablet Take 1 tablet (500 mg total) by mouth 2 (two) times daily with a meal. 10/15/19   Lavonia Drafts, MD    Allergies Patient has no known allergies.  Family History  Problem Relation Age of Onset   Liver disease Mother     Social History Social History   Tobacco Use   Smoking status: Never   Smokeless tobacco: Never  Substance Use Topics   Alcohol use: No    Review of Systems  Review of Systems  Constitutional:  Positive for fatigue. Negative for fever.  HENT:  Negative for congestion and sore throat.   Eyes:  Negative for visual disturbance.  Respiratory:  Negative for cough and shortness of breath.   Cardiovascular:  Positive for leg swelling. Negative for chest pain.  Gastrointestinal:  Negative for abdominal pain, diarrhea, nausea and vomiting.  Genitourinary:  Negative for flank pain.  Musculoskeletal:  Negative for back pain and neck pain.  Skin:  Positive for rash and wound.  Neurological:  Negative for weakness.  All other systems reviewed and are negative.   ____________________________________________  PHYSICAL EXAM:      VITAL SIGNS: ED Triage Vitals  Enc Vitals Group     BP 02/17/21 1435 (!)  194/75     Pulse Rate 02/17/21 1433 (!) 101     Resp 02/17/21 1433 18     Temp 02/17/21 1433 98.5 F (36.9 C)     Temp Source 02/17/21 1433 Oral     SpO2 02/17/21 1433 99 %     Weight 02/17/21 1434 180 lb (81.6 kg)     Height 02/17/21 1434 '5\' 7"'  (1.702 m)     Head Circumference --      Peak Flow --      Pain Score 02/17/21 1434 8     Pain Loc --      Pain Edu? --      Excl. in [place]? --      Physical Exam Vitals and nursing note reviewed.  Constitutional:      General: She is not in acute distress.    Appearance: She is well-developed.  HENT:     Head: Normocephalic and  atraumatic.  Eyes:     Conjunctiva/sclera: Conjunctivae normal.  Cardiovascular:     Rate and Rhythm: Normal rate and regular rhythm.     Heart sounds: Normal heart sounds. No murmur heard.   No friction rub.  Pulmonary:     Effort: Pulmonary effort is normal. No respiratory distress.     Breath sounds: Normal breath sounds. No wheezing or rales.  Abdominal:     General: There is no distension.     Palpations: Abdomen is soft.     Tenderness: There is no abdominal tenderness.  Musculoskeletal:     Cervical back: Neck supple.  Skin:    General: Skin is warm.     Capillary Refill: Capillary refill takes less than 2 seconds.  Neurological:     Mental Status: She is alert and oriented to person, place, and time.     Motor: No abnormal muscle tone.     LOWER EXTREMITY EXAM: RIGHT  INSPECTION & PALPATION: Superficial ulceration to right fifth toe, with purplish discoloration and wet ulceration. No fluctuance. Moderate swelling and significant warmth extending throughout the foot and distal lower leg.  SENSORY: sensation is intact to light touch in:  Superficial peroneal nerve distribution (over dorsum of foot) Deep peroneal nerve distribution (over first dorsal web space) Sural nerve distribution (over lateral aspect 5th metatarsal) Saphenous nerve distribution (over medial instep)  MOTOR:  + Motor EHL (great toe dorsiflexion) + FHL (great toe plantar flexion)  + TA (ankle dorsiflexion)  + GSC (ankle plantar flexion)  VASCULAR: 2+ dorsalis pedis and posterior tibialis pulses Capillary refill < 2 sec, toes warm and well-perfused  COMPARTMENTS: Soft, warm, well-perfused No pain with passive extension No parethesias  ____________________________________________   LABS (all labs ordered are listed, but only abnormal results are displayed)  Labs Reviewed  CBC - Abnormal; Notable for the following components:      Result Value   RBC 3.68 (*)    HCT 34.0 (*)    All  other components within normal limits  BASIC METABOLIC PANEL - Abnormal; Notable for the following components:   Glucose, Bld 301 (*)    Creatinine, Ser 1.27 (*)    GFR, Estimated 45 (*)    All other components within normal limits  SEDIMENTATION RATE - Abnormal; Notable for the following components:   Sed Rate 77 (*)    All other components within normal limits  CULTURE, BLOOD (SINGLE)  RESP PANEL BY RT-PCR (FLU A&B, COVID) ARPGX2  CULTURE, BLOOD (ROUTINE X 2)  CULTURE, BLOOD (ROUTINE X 2)  C-REACTIVE PROTEIN  HEMOGLOBIN A1C  PREALBUMIN  COMPREHENSIVE METABOLIC PANEL  CBC  LIPID PANEL    ____________________________________________  EKG:  ________________________________________  RADIOLOGY All imaging, including plain films, CT scans, and ultrasounds, independently reviewed by me, and interpretations confirmed via formal radiology reads.  ED MD interpretation:   XR: No acute abnormality  Official radiology report(s): DG Foot Complete Right  Result Date: 02/17/2021 CLINICAL DATA:  Foot wound, swelling EXAM: RIGHT FOOT COMPLETE - 3+ VIEW COMPARISON:  None. FINDINGS: No acute bony abnormality. Specifically, no fracture, subluxation, or dislocation. No bone destruction to suggest osteomyelitis. Joint spaces maintained. Diffuse vascular calcifications. IMPRESSION: No acute bony abnormality. Electronically Signed   By: Rolm Baptise M.D.   On: 02/17/2021 18:40    ____________________________________________  PROCEDURES   Procedure(s) performed (including Critical Care):  .1-3 Lead EKG Interpretation Performed by: Duffy Bruce, MD Authorized by: Duffy Bruce, MD     Interpretation: normal     ECG rate:  90-100   ECG rate assessment: normal     Rhythm: sinus rhythm     Ectopy: none     Conduction: normal   Comments:     Indication: Infection, possible sepsis  ____________________________________________  INITIAL IMPRESSION / MDM / ASSESSMENT AND PLAN / ED  COURSE  As part of my medical decision making, I reviewed the following data within the [place] notes reviewed and incorporated, Old chart reviewed, Notes from prior ED visits, and [place] Controlled Substance [place] B Wence was evaluated in Emergency Department on 02/17/2021 for the symptoms described in the history of present illness. She was evaluated in the context of the global COVID-19 pandemic, which necessitated consideration that the patient might be at risk for infection with the SARS-CoV-2 virus that causes COVID-19. Institutional protocols and algorithms that pertain to the evaluation of patients at risk for COVID-19 are in a state of rapid change based on information released by regulatory bodies including the CDC and federal and state organizations. These policies and algorithms were followed during the patient's care in the ED.  Some ED evaluations and interventions may be delayed as a result of limited staffing during the pandemic.*     Medical Decision Making: 70 year old female here with right fifth toe ulceration and warmth.  Patient is very well-appearing.  Exam, however, is concerning for possible osteo and cellulitis of the right leg.  Plain film negative.  Lab work is overall reassuring.  Normal white blood cell count.  ESR 77.  This, along with her poorly controlled diabetes, and degree of swelling on exam, makes me concerned for outpatient management.  Will obtain MRI, admit for antibiotics and wound care.  ____________________________________________  FINAL CLINICAL IMPRESSION(S) / ED DIAGNOSES  Final diagnoses:  Diabetic ulcer of toe of left foot associated with diabetes mellitus due to underlying condition, limited to breakdown of skin ([place])     MEDICATIONS GIVEN DURING THIS VISIT:  Medications  vancomycin (VANCOCIN) IVPB 1000 mg/200 mL premix (0 mg Intravenous Stopped 02/17/21 2100)    Followed by  vancomycin  (VANCOCIN) IVPB 1000 mg/200 mL premix (has no administration in time range)  0.9 %  sodium chloride infusion (has no administration in time range)  insulin aspart (novoLOG) injection 4 Units (has no administration in time range)  hydrALAZINE (APRESOLINE) tablet 25 mg (has no administration in time range)  cefTRIAXone (ROCEPHIN) 2 g in sodium chloride 0.9 % 100 mL IVPB (has no administration in time  range)  insulin aspart (novoLOG) injection 0-15 Units (has no administration in time range)  heparin injection 5,000 Units (has no administration in time range)  sodium chloride flush (NS) 0.9 % injection 3 mL (has no administration in time range)  acetaminophen (TYLENOL) tablet 650 mg (has no administration in time range)    Or  acetaminophen (TYLENOL) suppository 650 mg (has no administration in time range)  0.9 %  sodium chloride infusion (has no administration in time range)  vancomycin variable dose per unstable renal function (pharmacist dosing) (has no administration in time range)  cefTRIAXone (ROCEPHIN) 2 g in sodium chloride 0.9 % 100 mL IVPB (0 g Intravenous Stopped 02/17/21 1948)     ED Discharge Orders     None        Note:  This document was prepared using Dragon voice recognition software and may include unintentional dictation errors.   Duffy Bruce, MD 02/17/21 2124    Duffy Bruce, MD 02/17/21 2125

## 2021-02-17 NOTE — Progress Notes (Addendum)
Pharmacy Antibiotic Note  Ashley Ellison is a 70 y.o. female with history of hypertension, diabetes, not currently treated, presenting with R foot pain on 02/17/2021.  Pharmacy has been consulted for vancomycin dosing for osteomyelitis.  Plan: Pt to receive vancomycin 2 g IV loading dose in ED. Will hold off on ordering maintenance dose given Scr 1.27 (based on past labs baselines seems closer to 0.8). Based on current Scr, dose would not be due for 36 hours.   Assess renal function daily and adjust dose as clinically indicated Obtain vancomycin levels around steady state if continued Follow up cultures   Height: 5\' 7"  (170.2 cm) Weight: 81.6 kg (180 lb) IBW/kg (Calculated) : 61.6  Temp (24hrs), Avg:98.5 F (36.9 C), Min:98.5 F (36.9 C), Max:98.5 F (36.9 C)  Recent Labs  Lab 02/17/21 1436  WBC 7.2  CREATININE 1.27*    Estimated Creatinine Clearance: 44.6 mL/min (A) (by C-G formula based on SCr of 1.27 mg/dL (H)).    No Known Allergies  Antimicrobials this admission: 10/15 ceftriaxone >>  10/15 vancomycin >>  Dose adjustments this admission:   Microbiology results:  BCx:   UCx:   Sputum:   MRSA PCR:   Thank you for allowing pharmacy to be a part of this patient's care.  11/15 Ashley Ellison 02/17/2021 9:01 PM

## 2021-02-17 NOTE — ED Triage Notes (Signed)
Pt comes pov with right foot pain/injury. Pt stopped taking her diabetic meds because they stopped refilling it. Pt dropped a cinderblock on her foot about 3 weeks ago and 2 weeks ago noticed it wasn't getting better and a toe appeared black and swollen.

## 2021-02-17 NOTE — Progress Notes (Signed)
PHARMACY -  BRIEF ANTIBIOTIC NOTE   Pharmacy has received consult(s) for vancomycin from an ED provider.  The patient's profile has been reviewed for ht/wt/allergies/indication/available labs.    One time order(s) placed for vancomycin 2g  Further antibiotics/pharmacy consults should be ordered by admitting physician if indicated.                       Thank you, Robyne Peers Amairany Schumpert 02/17/2021  6:22 PM

## 2021-02-18 ENCOUNTER — Encounter: Payer: Self-pay | Admitting: Internal Medicine

## 2021-02-18 DIAGNOSIS — E1169 Type 2 diabetes mellitus with other specified complication: Secondary | ICD-10-CM

## 2021-02-18 DIAGNOSIS — E785 Hyperlipidemia, unspecified: Secondary | ICD-10-CM

## 2021-02-18 DIAGNOSIS — I96 Gangrene, not elsewhere classified: Secondary | ICD-10-CM

## 2021-02-18 LAB — CBC
HCT: 29.8 % — ABNORMAL LOW (ref 36.0–46.0)
Hemoglobin: 10.8 g/dL — ABNORMAL LOW (ref 12.0–15.0)
MCH: 32.9 pg (ref 26.0–34.0)
MCHC: 36.2 g/dL — ABNORMAL HIGH (ref 30.0–36.0)
MCV: 90.9 fL (ref 80.0–100.0)
Platelets: 237 10*3/uL (ref 150–400)
RBC: 3.28 MIL/uL — ABNORMAL LOW (ref 3.87–5.11)
RDW: 11.4 % — ABNORMAL LOW (ref 11.5–15.5)
WBC: 7.3 10*3/uL (ref 4.0–10.5)
nRBC: 0 % (ref 0.0–0.2)

## 2021-02-18 LAB — COMPREHENSIVE METABOLIC PANEL
ALT: 12 U/L (ref 0–44)
AST: 15 U/L (ref 15–41)
Albumin: 3.1 g/dL — ABNORMAL LOW (ref 3.5–5.0)
Alkaline Phosphatase: 36 U/L — ABNORMAL LOW (ref 38–126)
Anion gap: 10 (ref 5–15)
BUN: 12 mg/dL (ref 8–23)
CO2: 25 mmol/L (ref 22–32)
Calcium: 8.8 mg/dL — ABNORMAL LOW (ref 8.9–10.3)
Chloride: 103 mmol/L (ref 98–111)
Creatinine, Ser: 0.74 mg/dL (ref 0.44–1.00)
GFR, Estimated: >60 mL/min (ref >60)
Glucose, Bld: 175 mg/dL — ABNORMAL HIGH (ref 70–99)
Potassium: 3.3 mmol/L — ABNORMAL LOW (ref 3.5–5.1)
Sodium: 138 mmol/L (ref 135–145)
Total Bilirubin: 0.6 mg/dL (ref 0.3–1.2)
Total Protein: 6.7 g/dL (ref 6.5–8.1)

## 2021-02-18 LAB — PREALBUMIN: Prealbumin: 16.4 mg/dL — ABNORMAL LOW (ref 18–38)

## 2021-02-18 LAB — RESP PANEL BY RT-PCR (FLU A&B, COVID) ARPGX2
Influenza A by PCR: NEGATIVE
Influenza B by PCR: NEGATIVE
SARS Coronavirus 2 by RT PCR: NEGATIVE

## 2021-02-18 LAB — GLUCOSE, CAPILLARY
Glucose-Capillary: 147 mg/dL — ABNORMAL HIGH (ref 70–99)
Glucose-Capillary: 146 mg/dL — ABNORMAL HIGH (ref 70–99)

## 2021-02-18 LAB — C-REACTIVE PROTEIN: CRP: 1.5 mg/dL — ABNORMAL HIGH (ref <1.0)

## 2021-02-18 LAB — CBG MONITORING, ED: Glucose-Capillary: 183 mg/dL — ABNORMAL HIGH (ref 70–99)

## 2021-02-18 MED ORDER — INFLUENZA VAC A&B SA ADJ QUAD 0.5 ML IM PRSY
0.5000 mL | PREFILLED_SYRINGE | INTRAMUSCULAR | Status: AC
  Administered 2021-02-20: 0.5 mL via INTRAMUSCULAR
  Filled 2021-02-18: qty 0.5

## 2021-02-18 MED ORDER — VANCOMYCIN HCL 1750 MG/350ML IV SOLN
1750.0000 mg | INTRAVENOUS | Status: DC
Start: 2021-02-18 — End: 2021-02-20
  Administered 2021-02-18 – 2021-02-19 (×2): 1750 mg via INTRAVENOUS
  Filled 2021-02-18 (×2): qty 350

## 2021-02-18 MED ORDER — MORPHINE SULFATE (PF) 2 MG/ML IV SOLN
2.0000 mg | Freq: Three times a day (TID) | INTRAVENOUS | Status: DC | PRN
  Administered 2021-02-18 – 2021-02-21 (×8): 2 mg via INTRAVENOUS
  Filled 2021-02-18 (×8): qty 1

## 2021-02-18 MED ORDER — ATORVASTATIN CALCIUM 10 MG PO TABS
10.0000 mg | ORAL_TABLET | Freq: Every day | ORAL | Status: DC
  Administered 2021-02-18 – 2021-02-22 (×5): 10 mg via ORAL
  Filled 2021-02-18 (×5): qty 1

## 2021-02-18 NOTE — Consult Note (Signed)
  Subjective:  Patient ID: Ashley Ellison, female    DOB: Sep 18, 1950,  MRN: [id number]  A 70 y.o. female DM II, Htn, h/o tobacco abuse presents with right fifth digit gangrene superficial gangrene.  Patient states that this started few months ago and is slowly gotten worse.  She states that she dropped a cinder block 3 months ago.  She states that it has continued to gotten painful she did not noticed the discoloration because she is unable to see that far down.  She does have history of diabetes.  She has stopped taking her metformin.  She denies any nausea fever chills vomiting.  She does not see a podiatrist.  Her daughter sent her to the emergency room because of the discoloration. Objective:   Vitals:   02/18/21 0521 02/18/21 0938  BP: (!) 145/58 (!) 147/56  Pulse: 88 88  Resp: 20 18  Temp: 98.6 F (37 C)   SpO2: 98% 99%   General AA&O x3. Normal mood and affect.  Vascular Dorsalis pedis and posterior tibial pulses 2/4 bilat. Brisk capillary refill to all digits. Pedal hair present.  Neurologic Epicritic sensation grossly intact.  Dermatologic Right fifth digit gangrene dry stable with mild erythema. No purulent drainage noted.  No malodor present.  Orthopedic: MMT 5/5 in dorsiflexion, plantarflexion, inversion, and eversion. Normal joint ROM without pain or crepitus.     Assessment & Plan:  Patient was evaluated and treated and all questions answered.  Right fifth digit superficial gangrene with a history of possible peripheral vascular disease -All questions and concerns were discussed with the patient at bedside. -Patient does not have any history of vascular intervention however patient would benefit from ABIs PVRs given that she is starting to have superficial gangrene. -From foot and ankle standpoint she will benefit from Betadine wet-to-dry dressing changes.  No intervention at this time. -We will await vascular input given that this is primarily vascular in  nature. -Betadine wet-to-dry dressing changes -Weightbearing as tolerated with a surgical shoe -We will continue to follow  Candelaria Stagers, DPM  Accessible via secure chat for questions or concerns.

## 2021-02-18 NOTE — TOC Initial Note (Signed)
Transition of Care [facility]) - Initial/Assessment Note    Patient Details  Name: Ashley Ellison MRN: [id number] Date of Birth: [birth year]  [facility]) CM/SW Contact:    Marina Goodell Phone Number: [phone] 02/18/2021, 11:20 AM  Clinical Narrative:                  Patient presents to [facility] from home, due to right foot pain and swelling.  Patient stated she dropped a cinder block on her foot about a month ago and noticed her foot has not gotten better.  Patient stated small toe is black and is having a difficult time walking.  Patient's daughter Rossie Muskrat (Daughter) [phone] [facility] Phone) is the main contact.  Patient's metformin refills ran out over a year ago and patient has not made a follow-up appointment with her PCP to reinstated medication refills.    Expected Discharge Plan: Home w Home Health Services Barriers to Discharge: Continued Medical Work up   Patient Goals and CMS Choice Patient states their goals for this hospitalization and ongoing recovery are:: To return home soon      Expected Discharge Plan and Services Expected Discharge Plan: Home w Home Health Services In-house Referral: Clinical Social Work   Post Acute Care Choice: Home Health Living arrangements for the past 2 months: Single Family Home                                      Prior Living Arrangements/Services Living arrangements for the past 2 months: Single Family Home Lives with:: Self Patient language and need for interpreter reviewed:: Yes Do you feel safe going back to the place where you live?: Yes      Need for Family Participation in Patient Care: Yes (Comment) Care giver support system in place?: No (comment)   Criminal Activity/Legal Involvement Pertinent to Current Situation/Hospitalization: No - Comment as needed  Activities of Daily Living      Permission Sought/Granted      Share Information with NAME: Rossie Muskrat (Daughter)    [phone] (Home Phone)           Emotional Assessment Appearance:: Appears stated age Attitude/Demeanor/Rapport: Engaged Affect (typically observed): Stable Orientation: : Oriented to Self, Oriented to Place, Oriented to  Time, Oriented to Situation Alcohol / Substance Use: Tobacco Use Psych Involvement: No (comment)  Admission diagnosis:  Diabetic foot ulcer (HCC) [J68.115, L97.509] Patient Active Problem List   Diagnosis Date Noted   Diabetic foot ulcers (HCC) 02/17/2021   AKI (acute kidney injury) (HCC) 02/17/2021   Diabetic foot ulcer ([facility]CC) 02/17/2021   E coli bacteremia 03/19/2019   Diabetes mellitus without complication (HCC)    Hypertension associated with diabetes (HCC)    Hyperlipidemia associated with type 2 diabetes mellitus (HCC) 11/20/2013   PCP:  [name], MD Pharmacy:   [facility] [phone] - [place], [place] - [address] [place] [address] Phone: [phone] Fax: [phone]     Social Determinants of Health (SDOH) Interventions    Readmission Risk Interventions No flowsheet data found.

## 2021-02-18 NOTE — Progress Notes (Signed)
PROGRESS NOTE    [name]  RXV:[id number] DOB: 1950-12-08 DOA: 02/17/2021 PCP: [name], MD   Brief Narrative:  Per admitting physician: Ashley Ellison is a 70 y.o. female seen in ed with complaints of rt 5 th MTP. Diabetic foot infection and cellulitis- rt fifth toe/ swelling and erythema, Ulcer x few months.  Pt dropped a cinder block on her foot  3 months ago and when her daughter came today to see it  They came here.redness and pain since few months, pt could not see the bottom of her foot,.  Pt states she could walk on it,. Swelling about a week ago. Pt states during covid she ran out and does not know how to order things online. Pt does not see podiatrist. Pt out of her diabetes medication over two years.    Pt has past medical history of DM II, Htn, h/o tobacco abuse.   Assessment & Plan:   Principal Problem:   Diabetic foot ulcers (HCC) Active Problems:   Hypertension associated with diabetes (HCC)   AKI (acute kidney injury) (HCC)   Hyperlipidemia associated with type 2 diabetes mellitus (HCC)   Diabetic foot ulcer (HCC)   Diabetic foot ulcer: We will continue patient on current IV antibiotic regimen initiated in the ED with vancomycin and rocephin.   Patient was seen by podiatry in consultation, they initiated vascular evaluation and ABIs Will hold off on MRI until further vascular evaluation   Acute kidney injury, Attribute to combination of medications and dehydration. Patient's lisinopril and metformin.  Stop patient's naproxen. Continue to monitor patient overnight continue with maintenance IV fluids overnight. We will hold patient's nephrotoxic meds and renally dose all needed antibiotics.    Hypertension: Blood pressure elevated on admission Improved on current regimen   Hyperlipidemia: Initiated statin therapy  DVT prophylaxis: Heparin SQ  Code Status: FULL    Code Status Orders  (From admission, onward)            Start     Ordered   02/17/21 2037  Full code  Continuous        02/17/21 2047           Code Status History     Date Active Date Inactive Code Status Order ID Comments User Context   03/19/2019 2351 03/21/2019 1954 Full Code [id number]  [name], MD ED      Family Communication: Discussed in detail with patient Disposition Plan:    Patient medically unstable for discharge requires continued vascular evaluation for possible podiatry intervention. Consults called:  Podiatry Admission status: Inpatient   Consultants:  As above  Procedures:  MR FOOT RIGHT WO CONTRAST  Result Date: 02/18/2021 CLINICAL DATA:  Foot swelling, diabetic, osteomyelitis suspected, xray done EXAM: MRI OF THE RIGHT FOREFOOT WITHOUT CONTRAST TECHNIQUE: Multiplanar, multisequence MR imaging of the right forefoot was performed. No intravenous contrast was administered. COMPARISON:  X-ray 02/17/2021 FINDINGS: Bones/Joint/Cartilage Bone marrow edema within the middle phalanx and distal phalanx of the right fifth toe without confluent low T1 bone marrow signal (series 10, image 5). No cortical erosion. Marrow signal of the remaining forefoot is within normal limits. No fracture. No dislocation. Mild osteoarthritis of the forefoot, most pronounced at the first MTP joint. Ligaments Intact Lisfranc ligament. Collateral ligaments of the forefoot appear intact. Muscles and Tendons Denervation changes of the intrinsic foot musculature. Intact flexor and extensor tendons. No tenosynovitis. Soft tissues Generalized soft tissue edema. No organized fluid collection. No discernible  ulceration. IMPRESSION: 1. Bone marrow edema within the middle phalanx and distal phalanx of the right fifth toe without confluent low T1 bone marrow signal. 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 2. Generalized soft tissue edema. No organized fluid collection. 3. Mild osteoarthritis of  the forefoot, most pronounced at the first MTP joint. Electronically Signed   By: Duanne Guess D.O.   On: 02/18/2021 09:47   DG Foot Complete Right  Result Date: 02/17/2021 CLINICAL DATA:  Foot wound, swelling EXAM: RIGHT FOOT COMPLETE - 3+ VIEW COMPARISON:  None. FINDINGS: No acute bony abnormality. Specifically, no fracture, subluxation, or dislocation. No bone destruction to suggest osteomyelitis. Joint spaces maintained. Diffuse vascular calcifications. IMPRESSION: No acute bony abnormality. Electronically Signed   By: Charlett Nose M.D.   On: 02/17/2021 18:40       Subjective: No acute events patient reports feels unchanged from admission  Objective: Vitals:   02/18/21 0938 02/18/21 1243 02/18/21 1454 02/18/21 1630  BP: (!) 147/56 (!) 150/56 131/77 (!) 161/66  Pulse: 88 87 89 92  Resp: 18 16 16 12   Temp:   99.3 F (37.4 C) 99.3 F (37.4 C)  TempSrc:   Oral Oral  SpO2: 99% 98% 97% 100%  Weight:      Height:        Intake/Output Summary (Last 24 hours) at 02/18/2021 1704 Last data filed at 02/18/2021 1700 Gross per 24 hour  Intake 1502.03 ml  Output --  Net 1502.03 ml   Filed Weights   02/17/21 1434  Weight: 81.6 kg    Examination:  General exam: Appears calm and comfortable  Respiratory system: Clear to auscultation. Respiratory effort normal. Cardiovascular system: S1 & S2 heard, RRR. No JVD, murmurs, rubs, gallops or clicks. No pedal edema. Gastrointestinal system: Abdomen is nondistended, soft and nontender. No organomegaly or masses felt. Normal bowel sounds heard. Central nervous system: Alert and oriented. No focal neurological deficits. Extremities: Symmetric 5 x 5 power. Skin: No rashes, lesions or ulcers Psychiatry: Judgement and insight appear normal. Mood & affect appropriate.     Data Reviewed: I have personally reviewed following labs and imaging studies  CBC: Recent Labs  Lab 02/17/21 1436 02/18/21 0645  WBC 7.2 7.3  HGB 12.1 10.8*   HCT 34.0* 29.8*  MCV 92.4 90.9  PLT 268 237   Basic Metabolic Panel: Recent Labs  Lab 02/17/21 1436 02/18/21 0645  NA 136 138  K 3.6 3.3*  CL 101 103  CO2 27 25  GLUCOSE 301* 175*  BUN 14 12  CREATININE 1.27* 0.74  CALCIUM 9.3 8.8*   GFR: Estimated Creatinine Clearance: 70.9 mL/min (by C-G formula based on SCr of 0.74 mg/dL). Liver Function Tests: Recent Labs  Lab 02/18/21 0645  AST 15  ALT 12  ALKPHOS 36*  BILITOT 0.6  PROT 6.7  ALBUMIN 3.1*   No results for input(s): LIPASE, AMYLASE in the last 168 hours. No results for input(s): AMMONIA in the last 168 hours. Coagulation Profile: No results for input(s): INR, PROTIME in the last 168 hours. Cardiac Enzymes: No results for input(s): CKTOTAL, CKMB, CKMBINDEX, TROPONINI in the last 168 hours. BNP (last 3 results) No results for input(s): PROBNP in the last 8760 hours. HbA1C: No results for input(s): HGBA1C in the last 72 hours. CBG: Recent Labs  Lab 02/17/21 2250 02/18/21 1136 02/18/21 1632  GLUCAP 195* 183* 147*   Lipid Profile: Recent Labs    02/17/21 1436  CHOL 195  HDL 54  LDLCALC 112*  TRIG 143  CHOLHDL 3.6   Thyroid Function Tests: No results for input(s): TSH, T4TOTAL, FREET4, T3FREE, THYROIDAB in the last 72 hours. Anemia Panel: No results for input(s): VITAMINB12, FOLATE, FERRITIN, TIBC, IRON, RETICCTPCT in the last 72 hours. Sepsis Labs: No results for input(s): PROCALCITON, LATICACIDVEN in the last 168 hours.  Recent Results (from the past 240 hour(s))  Resp Panel by RT-PCR (Flu A&B, Covid) Nasopharyngeal Swab     Status: None   Collection Time: 02/17/21  9:15 AM   Specimen: Nasopharyngeal Swab; Nasopharyngeal(NP) swabs in vial transport medium  Result Value Ref Range Status   SARS Coronavirus 2 by RT PCR NEGATIVE NEGATIVE Final    Comment: (NOTE) SARS-CoV-2 target nucleic acids are NOT DETECTED.  The SARS-CoV-2 RNA is generally detectable in upper respiratory specimens during  the acute phase of infection. The lowest concentration of SARS-CoV-2 viral copies this assay can detect is 138 copies/mL. A negative result does not preclude SARS-Cov-2 infection and should not be used as the sole basis for treatment or other patient management decisions. A negative result may occur with  improper specimen collection/handling, submission of specimen other than nasopharyngeal swab, presence of viral mutation(s) within the areas targeted by this assay, and inadequate number of viral copies(<138 copies/mL). A negative result must be combined with clinical observations, patient history, and epidemiological information. The expected result is Negative.  Fact Sheet for Patients:  BloggerCourse.com  Fact Sheet for Healthcare Providers:  SeriousBroker.it  This test is no t yet approved or cleared by the [place] FDA and  has been authorized for detection and/or diagnosis of SARS-CoV-2 by FDA under an Emergency Use Authorization (EUA). This EUA will remain  in effect (meaning this test can be used) for the duration of the COVID-19 declaration under Section 564(b)(1) of the Act, 21 U.S.C.section 360bbb-3(b)(1), unless the authorization is terminated  or revoked sooner.       Influenza A by PCR NEGATIVE NEGATIVE Final   Influenza B by PCR NEGATIVE NEGATIVE Final    Comment: (NOTE) The Xpert Xpress SARS-CoV-2/FLU/RSV plus assay is intended as an aid in the diagnosis of influenza from Nasopharyngeal swab specimens and should not be used as a sole basis for treatment. Nasal washings and aspirates are unacceptable for Xpert Xpress SARS-CoV-2/FLU/RSV testing.  Fact Sheet for Patients: BloggerCourse.com  Fact Sheet for Healthcare Providers: SeriousBroker.it  This test is not yet approved or cleared by the [place] FDA and has been authorized for detection and/or  diagnosis of SARS-CoV-2 by FDA under an Emergency Use Authorization (EUA). This EUA will remain in effect (meaning this test can be used) for the duration of the COVID-19 declaration under Section 564(b)(1) of the Act, 21 U.S.C. section 360bbb-3(b)(1), unless the authorization is terminated or revoked.  Performed at [facility], [address] [place], [place] [address]   Blood culture (single)     Status: None (Preliminary result)   Collection Time: 02/17/21  6:40 PM   Specimen: BLOOD  Result Value Ref Range Status   Specimen Description BLOOD LEFT ANTECUBITAL  Final   Special Requests   Final    BOTTLES DRAWN AEROBIC AND ANAEROBIC Blood Culture results may not be optimal due to an excessive volume of blood received in culture bottles   Culture   Final    NO GROWTH < 12 HOURS Performed at [facility], [address], [place], [place] [address]    Report Status PENDING  Incomplete  Radiology Studies: MR FOOT RIGHT WO CONTRAST  Result Date: 02/18/2021 CLINICAL DATA:  Foot swelling, diabetic, osteomyelitis suspected, xray done EXAM: MRI OF THE RIGHT FOREFOOT WITHOUT CONTRAST TECHNIQUE: Multiplanar, multisequence MR imaging of the right forefoot was performed. No intravenous contrast was administered. COMPARISON:  X-ray 02/17/2021 FINDINGS: Bones/Joint/Cartilage Bone marrow edema within the middle phalanx and distal phalanx of the right fifth toe without confluent low T1 bone marrow signal (series 10, image 5). No cortical erosion. Marrow signal of the remaining forefoot is within normal limits. No fracture. No dislocation. Mild osteoarthritis of the forefoot, most pronounced at the first MTP joint. Ligaments Intact Lisfranc ligament. Collateral ligaments of the forefoot appear intact. Muscles and Tendons Denervation changes of the intrinsic foot musculature. Intact flexor and extensor tendons. No tenosynovitis. Soft tissues Generalized soft tissue edema. No  organized fluid collection. No discernible ulceration. IMPRESSION: 1. Bone marrow edema within the middle phalanx and distal phalanx of the right fifth toe without confluent low T1 bone marrow signal. 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 2. Generalized soft tissue edema. No organized fluid collection. 3. Mild osteoarthritis of the forefoot, most pronounced at the first MTP joint. Electronically Signed   By: Duanne Guess D.O.   On: 02/18/2021 09:47   DG Foot Complete Right  Result Date: 02/17/2021 CLINICAL DATA:  Foot wound, swelling EXAM: RIGHT FOOT COMPLETE - 3+ VIEW COMPARISON:  None. FINDINGS: No acute bony abnormality. Specifically, no fracture, subluxation, or dislocation. No bone destruction to suggest osteomyelitis. Joint spaces maintained. Diffuse vascular calcifications. IMPRESSION: No acute bony abnormality. Electronically Signed   By: Charlett Nose M.D.   On: 02/17/2021 18:40        Scheduled Meds:  heparin  5,000 Units Subcutaneous Q8H   hydrALAZINE  25 mg Oral Q8H   [START ON 02/19/2021] influenza vaccine adjuvanted  0.5 mL Intramuscular Tomorrow-1000   insulin aspart  0-15 Units Subcutaneous TID WC   sodium chloride flush  3 mL Intravenous Q12H   Continuous Infusions:  sodium chloride 75 mL/hr at 02/18/21 1233   cefTRIAXone (ROCEPHIN)  IV     vancomycin       LOS: 1 day    Time spent: 17 MIN    Burke Keels, MD [facility]  If 7PM-7AM, please contact night-coverage  02/18/2021, 5:04 PM

## 2021-02-18 NOTE — Progress Notes (Signed)
Pharmacy Antibiotic Note  Ashley Ellison is a 70 y.o. female with history of hypertension, diabetes, not currently treated, presenting with R foot pain on 02/17/2021.  Pharmacy has been consulted for vancomycin dosing for ?osteomyelitis.  -f/u vascular consult - also on Ceftriaxone 2 gm q24h  Plan: Pt to receive vancomycin 2 g IV loading dose in ED. Will hold off on ordering maintenance dose given Scr 1.27 (based on past labs baselines seems closer to 0.8). Based on current Scr, dose would not be due for 36 hours.   Assess renal function daily and adjust dose as clinically indicated Obtain vancomycin levels around steady state if continued Follow up cultures  10/16: Scr improved 1.27 >>0.74 Will order Vancomycin 1750 mg IV q24h Goal AUC 400-550. Expected AUC: 527 SCr used: 0.80 (rounded) Cmin 11.6    Height: 5\' 7"  (170.2 cm) Weight: 81.6 kg (180 lb) IBW/kg (Calculated) : 61.6  Temp (24hrs), Avg:98.6 F (37 C), Min:98.5 F (36.9 C), Max:98.6 F (37 C)  Recent Labs  Lab 02/17/21 1436 02/18/21 0645  WBC 7.2 7.3  CREATININE 1.27* 0.74     Estimated Creatinine Clearance: 70.9 mL/min (by C-G formula based on SCr of 0.74 mg/dL).    No Known Allergies  Antimicrobials this admission: 10/15 ceftriaxone >>  10/15 vancomycin >>  Dose adjustments this admission:   Microbiology results:  10/15 BCx: NG <12 hr 10/16 Bcx pend  UCx:   Sputum:   MRSA PCR:   Thank you for allowing pharmacy to be a part of this patient's care.  Savayah Waltrip A 02/18/2021 11:12 AM

## 2021-02-19 DIAGNOSIS — I96 Gangrene, not elsewhere classified: Secondary | ICD-10-CM

## 2021-02-19 DIAGNOSIS — E11621 Type 2 diabetes mellitus with foot ulcer: Secondary | ICD-10-CM

## 2021-02-19 DIAGNOSIS — L97519 Non-pressure chronic ulcer of other part of right foot with unspecified severity: Secondary | ICD-10-CM

## 2021-02-19 LAB — CBC WITH DIFFERENTIAL/PLATELET
Abs Immature Granulocytes: 0.01 10*3/uL (ref 0.00–0.07)
Basophils Absolute: 0.1 10*3/uL (ref 0.0–0.1)
Basophils Relative: 1 %
Eosinophils Absolute: 0.3 10*3/uL (ref 0.0–0.5)
Eosinophils Relative: 4 %
HCT: 29.8 % — ABNORMAL LOW (ref 36.0–46.0)
Hemoglobin: 10.6 g/dL — ABNORMAL LOW (ref 12.0–15.0)
Immature Granulocytes: 0 %
Lymphocytes Relative: 32 %
Lymphs Abs: 2.2 10*3/uL (ref 0.7–4.0)
MCH: 32.5 pg (ref 26.0–34.0)
MCHC: 35.6 g/dL (ref 30.0–36.0)
MCV: 91.4 fL (ref 80.0–100.0)
Monocytes Absolute: 0.6 10*3/uL (ref 0.1–1.0)
Monocytes Relative: 9 %
Neutro Abs: 3.7 10*3/uL (ref 1.7–7.7)
Neutrophils Relative %: 54 %
Platelets: 236 10*3/uL (ref 150–400)
RBC: 3.26 MIL/uL — ABNORMAL LOW (ref 3.87–5.11)
RDW: 11.5 % (ref 11.5–15.5)
WBC: 6.8 10*3/uL (ref 4.0–10.5)
nRBC: 0 % (ref 0.0–0.2)

## 2021-02-19 LAB — GLUCOSE, CAPILLARY
Glucose-Capillary: 145 mg/dL — ABNORMAL HIGH (ref 70–99)
Glucose-Capillary: 165 mg/dL — ABNORMAL HIGH (ref 70–99)
Glucose-Capillary: 154 mg/dL — ABNORMAL HIGH (ref 70–99)
Glucose-Capillary: 189 mg/dL — ABNORMAL HIGH (ref 70–99)

## 2021-02-19 LAB — BASIC METABOLIC PANEL
Anion gap: 10 (ref 5–15)
BUN: 11 mg/dL (ref 8–23)
CO2: 22 mmol/L (ref 22–32)
Calcium: 8.5 mg/dL — ABNORMAL LOW (ref 8.9–10.3)
Chloride: 106 mmol/L (ref 98–111)
Creatinine, Ser: 0.8 mg/dL (ref 0.44–1.00)
GFR, Estimated: >60 mL/min (ref >60)
Glucose, Bld: 143 mg/dL — ABNORMAL HIGH (ref 70–99)
Potassium: 3.4 mmol/L — ABNORMAL LOW (ref 3.5–5.1)
Sodium: 138 mmol/L (ref 135–145)

## 2021-02-19 LAB — HEMOGLOBIN A1C
Hgb A1c MFr Bld: 10.3 % — ABNORMAL HIGH (ref 4.8–5.6)
Mean Plasma Glucose: 249 mg/dL

## 2021-02-19 MED ORDER — ENSURE MAX PROTEIN PO LIQD
11.0000 [oz_av] | Freq: Every day | ORAL | Status: DC
  Administered 2021-02-19 – 2021-02-20 (×2): 11 [oz_av] via ORAL
  Filled 2021-02-19: qty 330

## 2021-02-19 MED ORDER — POTASSIUM CHLORIDE CRYS ER 20 MEQ PO TBCR
40.0000 meq | EXTENDED_RELEASE_TABLET | Freq: Once | ORAL | Status: AC
  Administered 2021-02-19: 40 meq via ORAL
  Filled 2021-02-19: qty 2

## 2021-02-19 MED ORDER — JUVEN PO PACK
1.0000 | PACK | Freq: Two times a day (BID) | ORAL | Status: DC
  Administered 2021-02-20 (×2): 1 via ORAL

## 2021-02-19 NOTE — Progress Notes (Signed)
Initial Nutrition Assessment  DOCUMENTATION CODES:  Not applicable  INTERVENTION:  Continue current diet as ordered Ensure Max po daily, each supplement provides 150 kcal and 30 grams of protein.  Juven BID, each packet provides 95 calories, 2.5 grams of protein (collagen), and 9.8 grams of carbohydrate (3 grams sugar); also contains 7 grams of L-arginine and L-glutamine, 300 mg vitamin C, 15 mg vitamin E, 1.2 mcg vitamin B-12, 9.5 mg zinc, 200 mg calcium, and 1.5 g  Calcium Beta-hydroxy-Beta-methylbutyrate to support wound healing  NUTRITION DIAGNOSIS:  Increased nutrient needs related to wound healing as evidenced by estimated needs.  GOAL:  Patient will meet greater than or equal to 90% of their needs  MONITOR:  PO intake, Supplement acceptance, Skin  REASON FOR ASSESSMENT:  Consult Wound healing  ASSESSMENT:  70 y.o. female with history of HTN, DM, and HLD presented to ED with a painful right foot wound. Reports dropping a cinder block on her toe about a month ago with minimal improvement in pain and appearance.  Pt resting in bed at the time of assessment. Pt reports that she is eating more than she normally does at home. Pt reports that at baseline she eats a small breakfast (bagel/cream cheese) a snack mid-day when she is at work, and then a dinner (fish and a side).   Pt reports stable weight and that she is active at baseline.   Discussed with pt the importance of adequate nutrition in wound healing. Pt states her husband drinks nutrition supplements so she is familiar with them. Agreeable to receiving for added protein.   Average Meal Intake: 10/15-10/17: 88% intake x 2 recorded meals  Nutritionally Relevant Medications: Scheduled Meds:  atorvastatin  10 mg Oral Daily   insulin aspart  0-15 Units Subcutaneous TID WC   Continuous Infusions:  cefTRIAXone (ROCEPHIN)  IV 2 g (02/18/21 2019)   vancomycin 1,750 mg (02/18/21 2053)   Labs Reviewed: K 3.4 SBG ranges  from 146-183 mg/dL over the last 24 hours HgbA1c 10.3% (10/15)  NUTRITION - FOCUSED PHYSICAL EXAM: Flowsheet Row Most Recent Value  Orbital Region No depletion  Upper Arm Region No depletion  Thoracic and Lumbar Region No depletion  Buccal Region No depletion  Clavicle Bone Region No depletion  Clavicle and Acromion Bone Region No depletion  Scapular Bone Region No depletion  Dorsal Hand No depletion  Patellar Region No depletion  Anterior Thigh Region No depletion  Posterior Calf Region No depletion  Edema (RD Assessment) None  Hair Reviewed  Eyes Reviewed  Mouth Reviewed  Skin Reviewed  Nails Reviewed   Diet Order:   Diet Order             Diet Carb Modified Fluid consistency: Thin; Room service appropriate? Yes  Diet effective now                   EDUCATION NEEDS:  Education needs have been addressed  Skin:  Skin Assessment: Reviewed RN Assessment (necrotic 5th toe to the right foot)  Last BM:  10/15  Height:  Ht Readings from Last 1 Encounters:  02/17/21 5\' 7"  (1.702 m)   Weight:  Wt Readings from Last 1 Encounters:  02/17/21 81.6 kg    Ideal Body Weight:  61.4 kg  BMI:  Body mass index is 28.19 kg/m.  Estimated Nutritional Needs:  Kcal:  1800-2000 kcal/d Protein:  90-100 g/d Fluid:  1.8-2 L/d   02/19/21, RD, LDN Clinical Dietitian RD pager # available in  [facility]  After hours/weekend pager # available in [facility]

## 2021-02-19 NOTE — Progress Notes (Signed)
  Subjective:  Patient ID: Ashley Ellison, female    DOB: [birth year],  MRN: [id number]   Feeling OK, no pain in toe   Negative for chest pain and shortness of breath Review of all other systems is negative Objective:   Vitals:   02/19/21 1610 02/19/21 2021  BP: (!) 156/66 (!) 152/62  Pulse: 86 93  Resp:  18  Temp: 98.6 F (37 C) 99.3 F (37.4 C)  SpO2: 99% 98%   General AA&O x3. Normal mood and affect.  Vascular Palpable DP pulse, Non palp PT pulse  Neurologic Epicritic sensation grossly intact.  Dermatologic Gangrenous changes 5th toe stable  Orthopedic: MMT 5/5 in dorsiflexion, plantarflexion, inversion, and eversion. Normal joint ROM without pain or crepitus.    Assessment & Plan:  Patient was evaluated and treated and all questions answered.  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 If flow  decent  on studies can d/c and f/u next week to  decide on outpatient amputation.  If evidence of PAD will need  vascular surgery eval  -Will continue to follow   Edwin Cap, DPM  Accessible via secure chat for questions or concerns.

## 2021-02-19 NOTE — Progress Notes (Addendum)
PROGRESS NOTE    Ashley Ellison  OVZ:[id number] DOB: [birth year] DOA: 02/17/2021 PCP: [name], MD   Brief Narrative:  Ashley Ellison is a 70 y.o. female with history of hypertension, hyperlipidemia, diabetes presented to hospital with right fifth toe swelling and redness.  Patient had an ulcer for few months but recently has been noticing more redness and pain and had been out of her diabetic medications for over 2 years.    Assessment & Plan:   Principal Problem:   Diabetic foot ulcers (HCC) Active Problems:   Hypertension associated with diabetes (HCC)   AKI (acute kidney injury) (HCC)   Hyperlipidemia associated with type 2 diabetes mellitus (HCC)   Diabetic foot ulcer (HCC)  Diabetic foot ulcer: Currently on vancomycin and rocephin.  Patient was seen by podiatry who recommended vascular evaluation with ABI.  Possibility of vascular ulcer.  MRI showed reactive osteitis rather than osteomyelit  Hemoglobin A1c of 10.3.  Used to be on metformin as outpatient. Will need to continue on discharge with better glycemic control in the long-term   Acute kidney injury, Resolved at this time.  Likely combination of medication and dehydration, lisinopril and metformin on hold.  Hypertension: Currently on hydralazine..  Monitor.  Hypokalemia.  We will replenish.   Hyperlipidemia: Continue Lipitor.  DVT prophylaxis: Heparin SQ   Code Status: FULL    Family Communication: Spoke with the patient  Disposition Plan:    Home  Consults called:  Podiatry  Admission status: Inpatient due to IV antibiotics, need for vascular evaluation   Procedures:  None so far   Subjective: Patient was seen and examined at bedside.  Feels okay.  Denies any pain, nausea, vomiting or fever or chills.    Objective: Vitals:   02/18/21 1630 02/18/21 1937 02/19/21 0455 02/19/21 0833  BP: (!) 161/66 (!) 157/67 140/66 (!) 158/66  Pulse: 92 89 76 88  Resp: 12 18 16    Temp: 99.3  F (37.4 C) 98.7 F (37.1 C) 98.8 F (37.1 C) 99.2 F (37.3 C)  TempSrc: Oral Oral Oral Oral  SpO2: 100% 99% 100% 97%  Weight:      Height:        Intake/Output Summary (Last 24 hours) at 02/19/2021 1546 Last data filed at 02/19/2021 1408 Gross per 24 hour  Intake 2761.02 ml  Output --  Net 2761.02 ml    Filed Weights   02/17/21 1434  Weight: 81.6 kg    Physical examination: General:  Average built, not in obvious distress HENT:   No scleral pallor or icterus noted. Oral mucosa is moist.  Chest:  Clear breath sounds.  Diminished breath sounds bilaterally. No crackles or wheezes.  CVS: S1 &S2 heard. No murmur.  Regular rate and rhythm. Abdomen: Soft, nontender, nondistended.  Bowel sounds are heard.   Extremities: No cyanosis, clubbing or edema.   Right fifth toe with dressing Psych: Alert, awake and oriented, normal mood CNS:  No cranial nerve deficits.  Power equal in all extremities.   Skin: Warm and dry.  No rashes noted.  Right fifth toe with dressing.   Data Reviewed: I have personally reviewed the following labs and imaging studies.    CBC: Recent Labs  Lab 02/17/21 1436 02/18/21 0645 02/19/21 0435  WBC 7.2 7.3 6.8  NEUTROABS  --   --  3.7  HGB 12.1 10.8* 10.6*  HCT 34.0* 29.8* 29.8*  MCV 92.4 90.9 91.4  PLT 268 237 236    Basic Metabolic  Panel: Recent Labs  Lab 02/17/21 1436 02/18/21 0645 02/19/21 0435  NA 136 138 138  K 3.6 3.3* 3.4*  CL 101 103 106  CO2 27 25 22   GLUCOSE 301* 175* 143*  BUN 14 12 11   CREATININE 1.27* 0.74 0.80  CALCIUM 9.3 8.8* 8.5*    GFR: Estimated Creatinine Clearance: 70.9 mL/min (by C-G formula based on SCr of 0.8 mg/dL). Liver Function Tests: Recent Labs  Lab 02/18/21 0645  AST 15  ALT 12  ALKPHOS 36*  BILITOT 0.6  PROT 6.7  ALBUMIN 3.1*    No results for input(s): LIPASE, AMYLASE in the last 168 hours. No results for input(s): AMMONIA in the last 168 hours. Coagulation Profile: No results for  input(s): INR, PROTIME in the last 168 hours. Cardiac Enzymes: No results for input(s): CKTOTAL, CKMB, CKMBINDEX, TROPONINI in the last 168 hours. BNP (last 3 results) No results for input(s): PROBNP in the last 8760 hours. HbA1C: Recent Labs    02/17/21 1840  HGBA1C 10.3*   CBG: Recent Labs  Lab 02/18/21 1136 02/18/21 1632 02/18/21 2104 02/19/21 0733 02/19/21 1215  GLUCAP 183* 147* 146* 145* 165*    Lipid Profile: Recent Labs    02/17/21 1436  CHOL 195  HDL 54  LDLCALC 112*  TRIG 143  CHOLHDL 3.6    Thyroid Function Tests: No results for input(s): TSH, T4TOTAL, FREET4, T3FREE, THYROIDAB in the last 72 hours. Anemia Panel: No results for input(s): VITAMINB12, FOLATE, FERRITIN, TIBC, IRON, RETICCTPCT in the last 72 hours. Sepsis Labs: No results for input(s): PROCALCITON, LATICACIDVEN in the last 168 hours.  Recent Results (from the past 240 hour(s))  Resp Panel by RT-PCR (Flu A&B, Covid) Nasopharyngeal Swab     Status: None   Collection Time: 02/17/21  9:15 AM   Specimen: Nasopharyngeal Swab; Nasopharyngeal(NP) swabs in vial transport medium  Result Value Ref Range Status   SARS Coronavirus 2 by RT PCR NEGATIVE NEGATIVE Final    Comment: (NOTE) SARS-CoV-2 target nucleic acids are NOT DETECTED.  The SARS-CoV-2 RNA is generally detectable in upper respiratory specimens during the acute phase of infection. The lowest concentration of SARS-CoV-2 viral copies this assay can detect is 138 copies/mL. A negative result does not preclude SARS-Cov-2 infection and should not be used as the sole basis for treatment or other patient management decisions. A negative result may occur with  improper specimen collection/handling, submission of specimen other than nasopharyngeal swab, presence of viral mutation(s) within the areas targeted by this assay, and inadequate number of viral copies(<138 copies/mL). A negative result must be combined with clinical observations,  patient history, and epidemiological information. The expected result is Negative.  Fact Sheet for Patients:  02/19/21  Fact Sheet for Healthcare Providers:  02/19/21  This test is no t yet approved or cleared by the [employer] FDA and  has been authorized for detection and/or diagnosis of SARS-CoV-2 by FDA under an Emergency Use Authorization (EUA). This EUA will remain  in effect (meaning this test can be used) for the duration of the COVID-19 declaration under Section 564(b)(1) of the Act, 21 U.S.C.section 360bbb-3(b)(1), unless the authorization is terminated  or revoked sooner.       Influenza A by PCR NEGATIVE NEGATIVE Final   Influenza B by PCR NEGATIVE NEGATIVE Final    Comment: (NOTE) The Xpert Xpress SARS-CoV-2/FLU/RSV plus assay is intended as an aid in the diagnosis of influenza from Nasopharyngeal swab specimens and should not be used as a sole basis  for treatment. Nasal washings and aspirates are unacceptable for Xpert Xpress SARS-CoV-2/FLU/RSV testing.  Fact Sheet for Patients: [employer]  Fact Sheet for Healthcare Providers: [employer]  This test is not yet approved or cleared by the [place] FDA and has been authorized for detection and/or diagnosis of SARS-CoV-2 by FDA under an Emergency Use Authorization (EUA). This EUA will remain in effect (meaning this test can be used) for the duration of the COVID-19 declaration under Section 564(b)(1) of the Act, 21 U.S.C. section 360bbb-3(b)(1), unless the authorization is terminated or revoked.  Performed at [facility], [address] [place], [place] [address]   Blood culture (single)     Status: None (Preliminary result)   Collection Time: 02/17/21  6:40 PM   Specimen: BLOOD  Result Value Ref Range Status   Specimen Description BLOOD LEFT ANTECUBITAL  Final    Special Requests   Final    BOTTLES DRAWN AEROBIC AND ANAEROBIC Blood Culture results may not be optimal due to an excessive volume of blood received in culture bottles   Culture   Final    NO GROWTH 2 DAYS Performed at [facility], [address] [place], [place] [address]    Report Status PENDING  Incomplete  Blood Cultures x 2 sites     Status: None (Preliminary result)   Collection Time: 02/18/21  6:45 AM   Specimen: BLOOD RIGHT HAND  Result Value Ref Range Status   Specimen Description BLOOD RIGHT HAND  Final   Special Requests   Final    BOTTLES DRAWN AEROBIC AND ANAEROBIC Blood Culture adequate volume   Culture   Final    NO GROWTH < 24 HOURS Performed at [facility], [address] [place], [place] [address]    Report Status PENDING  Incomplete  Blood Cultures x 2 sites     Status: None (Preliminary result)   Collection Time: 02/18/21  6:45 AM   Specimen: BLOOD LEFT HAND  Result Value Ref Range Status   Specimen Description BLOOD LEFT HAND  Final   Special Requests   Final    BOTTLES DRAWN AEROBIC AND ANAEROBIC Blood Culture results may not be optimal due to an excessive volume of blood received in culture bottles   Culture   Final    NO GROWTH < 24 HOURS Performed at [facility], [address] [place], [place] [address]    Report Status PENDING  Incomplete     Radiology Studies: MR FOOT RIGHT WO CONTRAST  Result Date: 02/18/2021 CLINICAL DATA:  Foot swelling, diabetic, osteomyelitis suspected, xray done EXAM: MRI OF THE RIGHT FOREFOOT WITHOUT CONTRAST TECHNIQUE: Multiplanar, multisequence MR imaging of the right forefoot was performed. No intravenous contrast was administered. COMPARISON:  X-ray 02/17/2021 FINDINGS: Bones/Joint/Cartilage Bone marrow edema within the middle phalanx and distal phalanx of the right fifth toe without confluent low T1 bone marrow signal (series 10, image 5). No cortical erosion. Marrow signal of the  remaining forefoot is within normal limits. No fracture. No dislocation. Mild osteoarthritis of the forefoot, most pronounced at the first MTP joint. Ligaments Intact Lisfranc ligament. Collateral ligaments of the forefoot appear intact. Muscles and Tendons Denervation changes of the intrinsic foot musculature. Intact flexor and extensor tendons. No tenosynovitis. Soft tissues Generalized soft tissue edema. No organized fluid collection. No discernible ulceration. IMPRESSION: 1. Bone marrow edema within the middle phalanx and distal phalanx of the right fifth toe without confluent low T1 bone marrow signal. Findings are favored to  represent reactive osteitis without definite evidence of acute osteomyelitis. 2. Generalized soft tissue edema. No organized fluid collection. 3. Mild osteoarthritis of the forefoot, most pronounced at the first MTP joint. Electronically Signed   By: Duanne Guess D.O.   On: 02/18/2021 09:47   DG Foot Complete Right  Result Date: 02/17/2021 CLINICAL DATA:  Foot wound, swelling EXAM: RIGHT FOOT COMPLETE - 3+ VIEW COMPARISON:  None. FINDINGS: No acute bony abnormality. Specifically, no fracture, subluxation, or dislocation. No bone destruction to suggest osteomyelitis. Joint spaces maintained. Diffuse vascular calcifications. IMPRESSION: No acute bony abnormality. Electronically Signed   By: Charlett Nose M.D.   On: 02/17/2021 18:40     Scheduled Meds:  atorvastatin  10 mg Oral Daily   heparin  5,000 Units Subcutaneous Q8H   hydrALAZINE  25 mg Oral Q8H   influenza vaccine adjuvanted  0.5 mL Intramuscular Tomorrow-1000   insulin aspart  0-15 Units Subcutaneous TID WC   [START ON 02/20/2021] nutrition supplement (JUVEN)  1 packet Oral BID BM   Ensure Max Protein  11 oz Oral Daily   sodium chloride flush  3 mL Intravenous Q12H   Continuous Infusions:  cefTRIAXone (ROCEPHIN)  IV 2 g (02/18/21 2019)   vancomycin 1,750 mg (02/18/21 2053)     LOS: 2 days    Joycelyn Das, MD [facility] If 7PM-7AM, please contact night-coverage  02/19/2021, 3:46 PM

## 2021-02-19 NOTE — Progress Notes (Signed)
Inpatient Diabetes Program Recommendations  AACE/ADA: New Consensus Statement on Inpatient Glycemic Control (2015)  Target Ranges:  Prepandial:   less than 140 mg/dL      Peak postprandial:   less than 180 mg/dL (1-2 hours)      Critically ill patients:  140 - 180 mg/dL   Lab Results  Component Value Date   GLUCAP 145 (H) 02/19/2021   HGBA1C 10.3 (H) 02/17/2021    Review of Glycemic Control  Diabetes history: DM 2 Outpatient Diabetes medications: Metformin 500 mg bid Current orders for Inpatient glycemic control:  Novolog 0-15 units tid  A1c 10.3% on 10/15  Spoke with pt at bedside regarding A1c and glucose control at home. Pt had been without her metformin for about 2 years. States she does not want to go into her doctors office due to [place] and her Dr. Would like to see her in person in order to refill her Metformin.  Pt had not been following a diabetic diet at home. Discussed importance of glucose control and taking her Metformin and checking her glucose once a day in the mornings. Pt had questions about the Freestyle Libre CGM. Discussed if her insurance would cover since she is not on insulin. Pt to follow up with her PCP for possible pre-authorization.  Pt's glucose controlled here with minimal Novolog correction. Would recommend restarting Metformin and lifestyle modifications, in addition to checking glucose Daily.  D/C: Blood Glucose Meter kit order #[id number] Daily checks Metformin refill  Thanks,  Tama Headings RN, MSN, BC-ADM Inpatient Diabetes Coordinator Team Pager [phone] (8a-5p)

## 2021-02-19 NOTE — Progress Notes (Signed)
   02/19/21 0955  Clinical Encounter Type  Visited With Patient  Visit Type Initial;Spiritual support;Social support  Referral From Nurse  Consult/Referral To Chaplain   Chaplain established initial pastoral care. Chaplain ministered with a calm presence.

## 2021-02-20 ENCOUNTER — Other Ambulatory Visit (INDEPENDENT_AMBULATORY_CARE_PROVIDER_SITE_OTHER): Payer: Self-pay | Admitting: Vascular Surgery

## 2021-02-20 ENCOUNTER — Inpatient Hospital Stay: Payer: Medicare Other

## 2021-02-20 DIAGNOSIS — I739 Peripheral vascular disease, unspecified: Secondary | ICD-10-CM

## 2021-02-20 LAB — GLUCOSE, CAPILLARY
Glucose-Capillary: 161 mg/dL — ABNORMAL HIGH (ref 70–99)
Glucose-Capillary: 160 mg/dL — ABNORMAL HIGH (ref 70–99)
Glucose-Capillary: 164 mg/dL — ABNORMAL HIGH (ref 70–99)
Glucose-Capillary: 147 mg/dL — ABNORMAL HIGH (ref 70–99)

## 2021-02-20 LAB — BASIC METABOLIC PANEL
Anion gap: 6 (ref 5–15)
BUN: 15 mg/dL (ref 8–23)
CO2: 23 mmol/L (ref 22–32)
Calcium: 8.9 mg/dL (ref 8.9–10.3)
Chloride: 111 mmol/L (ref 98–111)
Creatinine, Ser: 0.87 mg/dL (ref 0.44–1.00)
GFR, Estimated: >60 mL/min (ref >60)
Glucose, Bld: 133 mg/dL — ABNORMAL HIGH (ref 70–99)
Potassium: 3.7 mmol/L (ref 3.5–5.1)
Sodium: 140 mmol/L (ref 135–145)

## 2021-02-20 LAB — CBC
HCT: 31.2 % — ABNORMAL LOW (ref 36.0–46.0)
Hemoglobin: 10.6 g/dL — ABNORMAL LOW (ref 12.0–15.0)
MCH: 31 pg (ref 26.0–34.0)
MCHC: 34 g/dL (ref 30.0–36.0)
MCV: 91.2 fL (ref 80.0–100.0)
Platelets: 260 10*3/uL (ref 150–400)
RBC: 3.42 MIL/uL — ABNORMAL LOW (ref 3.87–5.11)
RDW: 11.6 % (ref 11.5–15.5)
WBC: 7.2 10*3/uL (ref 4.0–10.5)
nRBC: 0 % (ref 0.0–0.2)

## 2021-02-20 MED ORDER — VANCOMYCIN HCL 1500 MG/300ML IV SOLN
1500.0000 mg | INTRAVENOUS | Status: DC
  Administered 2021-02-20: 1500 mg via INTRAVENOUS
  Filled 2021-02-20: qty 300

## 2021-02-20 MED ORDER — SODIUM CHLORIDE 0.9 % IV SOLN: INTRAVENOUS | Status: DC

## 2021-02-20 NOTE — H&P (View-Only) (Signed)
[facility] Vascular Consult Note  MRN : [id number]  Ashley Ellison is a 70 y.o. (03/18/1951) female who presents with chief complaint of  Chief Complaint  Patient presents with   Foot Pain   History of Present Illness:  Ashley Ellison is a 70 year old female with a past medical history of hypertension, diabetes, hyperlipidemia who presented to the ED with complaints of chronic wounds to the right foot.   Patient states that this started few months ago and is slowly gotten worse.  She states that she dropped a cinder block 3 months ago.  She notes her discomfort has progressively worsened.  She states her daughter noticed the gangrenous changes to her toe as it is hard for her to see as far down as her feet.  Once her daughter noticed the gangrenous changes she sent her to our emergency department for further evaluation.  She denies fever, nausea vomiting.  Denies shortness of breath or chest pain. Patient denies any claudication-like symptoms or rest pain.  She has never been to a vascular surgeon.  Patient notes that she had recently stopped taking her hypoglycemic metformin as she ran out of this.  Vascular surgery was consulted by Dr. Pokhrel for possible vascular intervention in the setting of chronic wound to the fifth right toe.   Current Facility-Administered Medications  Medication Dose Route Frequency Provider Last Rate Last Admin   acetaminophen (TYLENOL) tablet 650 mg  650 mg Oral Q6H PRN Patel, Ekta V, MD   650 mg at 02/18/21 0200   Or   acetaminophen (TYLENOL) suppository 650 mg  650 mg Rectal Q6H PRN Patel, Ekta V, MD       atorvastatin (LIPITOR) tablet 10 mg  10 mg Oral Daily Spongberg, Christopher N, MD   10 mg at 02/20/21 0856   cefTRIAXone (ROCEPHIN) 2 g in sodium chloride 0.9 % 100 mL IVPB  2 g Intravenous Q24H Patel, Ekta V, MD 200 mL/hr at 02/19/21 1858 2 g at 02/19/21 1858   heparin injection 5,000 Units  5,000 Units Subcutaneous Q8H  Patel, Ekta V, MD   5,000 Units at 02/20/21 1435   hydrALAZINE (APRESOLINE) tablet 25 mg  25 mg Oral Q8H Patel, Ekta V, MD   25 mg at 02/20/21 1435   insulin aspart (novoLOG) injection 0-15 Units  0-15 Units Subcutaneous TID WC Patel, Ekta V, MD   3 Units at 02/20/21 1153   morphine 2 MG/ML injection 2 mg  2 mg Intravenous Q8H PRN Patel, Ekta V, MD   2 mg at 02/20/21 0549   nutrition supplement (JUVEN) (JUVEN) powder packet 1 packet  1 packet Oral BID BM Pokhrel, Laxman, MD   1 packet at 02/20/21 1441   protein supplement (ENSURE MAX) liquid  11 oz Oral Daily Pokhrel, Laxman, MD   11 oz at 02/20/21 0912   sodium chloride flush (NS) 0.9 % injection 3 mL  3 mL Intravenous Q12H Patel, Ekta V, MD   3 mL at 02/20/21 0907   vancomycin (VANCOREADY) IVPB 1500 mg/300 mL  1,500 mg Intravenous Q24H Rauer, Samantha O, RPH       Past Medical History:  Diagnosis Date   Diabetes mellitus without complication (HCC)    Hypertension    Past Surgical History:  Procedure Laterality Date   BACK SURGERY     VAGINAL DELIVERY     Social History Social History   Tobacco Use   Smoking status: Never   Smokeless tobacco: Never    Substance Use Topics   Alcohol use: No   Family History Family History  Problem Relation Age of Onset   Liver disease Mother   Denies family history of peripheral artery disease, disease or renal disease.  No Known Allergies  REVIEW OF SYSTEMS (Negative unless checked)  Constitutional: [] Weight loss  [] Fever  [] Chills Cardiac: [] Chest pain   [] Chest pressure   [] Palpitations   [] Shortness of breath when laying flat   [] Shortness of breath at rest   [] Shortness of breath with exertion. Vascular:  [] Pain in legs with walking   [] Pain in legs at rest   [] Pain in legs when laying flat   [] Claudication   [x] Pain in feet when walking  [x] Pain in feet at rest  [x] Pain in feet when laying flat   [] History of DVT   [] Phlebitis   [] Swelling in legs   [] Varicose veins   [x] Non-healing  ulcers Pulmonary:   [] Uses home oxygen   [] Productive cough   [] Hemoptysis   [] Wheeze  [] COPD   [] Asthma Neurologic:  [] Dizziness  [] Blackouts   [] Seizures   [] History of stroke   [] History of TIA  [] Aphasia   [] Temporary blindness   [] Dysphagia   [] Weakness or numbness in arms   [] Weakness or numbness in legs Musculoskeletal:  [] Arthritis   [] Joint swelling   [] Joint pain   [] Low back pain Hematologic:  [] Easy bruising  [] Easy bleeding   [] Hypercoagulable state   [] Anemic  [] Hepatitis Gastrointestinal:  [] Blood in stool   [] Vomiting blood  [] Gastroesophageal reflux/heartburn   [] Difficulty swallowing. Genitourinary:  [] Chronic kidney disease   [] Difficult urination  [] Frequent urination  [] Burning with urination   [] Blood in urine Skin:  [] Rashes   [x] Ulcers   [x] Wounds Psychological:  [] History of anxiety   []  History of major depression.  Physical Examination  Vitals:   02/20/21 0457 02/20/21 0750 02/20/21 1514 02/20/21 1533  BP: (!) 184/71 (!) 146/59 (!) 141/63 (!) 142/50  Pulse: 80 89 92 90  Resp: 18 18 14 18   Temp: 98.6 F (37 C) 98.7 F (37.1 C) 99 F (37.2 C) 99.3 F (37.4 C)  TempSrc: Oral Oral Oral Oral  SpO2: 97% 99% 99% 98%  Weight:      Height:       Body mass index is 28.19 kg/m. Gen:  WD/WN, NAD Head: [place]/AT, No temporalis wasting. Prominent temp pulse not noted. Ear/Nose/Throat: Hearing grossly intact, nares w/o erythema or drainage, oropharynx w/o Erythema/Exudate Eyes: Sclera non-icteric, conjunctiva clear Neck: Trachea midline.  No JVD.  Pulmonary:  Good air movement, respirations not labored, equal bilaterally.  Cardiac: RRR, normal S1, S2. Vascular:  Vessel Right Left  Radial Palpable Palpable  Ulnar Palpable Palpable  Brachial Palpable Palpable  Carotid Palpable, without bruit Palpable, without bruit  Aorta Not palpable N/A  Femoral Palpable Palpable  Popliteal Palpable Palpable  PT Non-Palpable Palpable  DP Faintly Palpable Palpable   Right Lower  Extremity: Thigh soft. Calf soft. Hard to palpate pedal pulses possibly a faint DP. Foot is warm. Dorsal sensory is intact.  Early gangrenous changes to the fourth toe.  Gangrenous changes to the fifth toe.  Gastrointestinal: soft, non-tender/non-distended. No guarding/reflex.  Musculoskeletal: M/S 5/5 throughout.  Extremities without ischemic changes.  No deformity or atrophy. No edema. Neurologic: Sensation grossly intact in extremities.  Symmetrical.  Speech is fluent. Motor exam as listed above. Psychiatric: Judgment intact, Mood & affect appropriate for pt's clinical situation. Dermatologic: As above  Lymph : No Cervical, Axillary, or Inguinal lymphadenopathy.  CBC Lab Results  Component Value Date   WBC 7.2 02/20/2021   HGB 10.6 (L) 02/20/2021   HCT 31.2 (L) 02/20/2021   MCV 91.2 02/20/2021   PLT 260 02/20/2021   BMET    Component Value Date/Time   NA 140 02/20/2021 0517   NA 135 (L) 03/10/2013 2008   K 3.7 02/20/2021 0517   K 4.2 03/10/2013 2008   CL 111 02/20/2021 0517   CL 101 03/10/2013 2008   CO2 23 02/20/2021 0517   CO2 24 03/10/2013 2008   GLUCOSE 133 (H) 02/20/2021 0517   GLUCOSE 327 (H) 03/10/2013 2008   BUN 15 02/20/2021 0517   BUN 8 03/10/2013 2008   CREATININE 0.87 02/20/2021 0517   CREATININE 0.88 03/10/2013 2008   CALCIUM 8.9 02/20/2021 0517   CALCIUM 10.3 (H) 03/10/2013 2008   GFRNONAA >60 02/20/2021 0517   GFRNONAA >60 03/10/2013 2008   GFRAA >60 03/20/2019 1858   GFRAA >60 03/10/2013 2008   Estimated Creatinine Clearance: 65.2 mL/min (by C-G formula based on SCr of 0.87 mg/dL).  COAG Lab Results  Component Value Date   INR 1.0 03/18/2019   Radiology MR FOOT RIGHT WO CONTRAST  Result Date: 02/18/2021 CLINICAL DATA:  Foot swelling, diabetic, osteomyelitis suspected, xray done EXAM: MRI OF THE RIGHT FOREFOOT WITHOUT CONTRAST TECHNIQUE: Multiplanar, multisequence MR imaging of the right forefoot was performed. No intravenous contrast was  administered. COMPARISON:  X-ray 02/17/2021 FINDINGS: Bones/Joint/Cartilage Bone marrow edema within the middle phalanx and distal phalanx of the right fifth toe without confluent low T1 bone marrow signal (series 10, image 5). No cortical erosion. Marrow signal of the remaining forefoot is within normal limits. No fracture. No dislocation. Mild osteoarthritis of the forefoot, most pronounced at the first MTP joint. Ligaments Intact Lisfranc ligament. Collateral ligaments of the forefoot appear intact. Muscles and Tendons Denervation changes of the intrinsic foot musculature. Intact flexor and extensor tendons. No tenosynovitis. Soft tissues Generalized soft tissue edema. No organized fluid collection. No discernible ulceration. IMPRESSION: 1. Bone marrow edema within the middle phalanx and distal phalanx of the right fifth toe without confluent low T1 bone marrow signal. 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 2. Generalized soft tissue edema. No organized fluid collection. 3. Mild osteoarthritis of the forefoot, most pronounced at the first MTP joint. Electronically Signed   By: Duanne Guess D.O.   On: 02/18/2021 09:47   US ARTERIAL ABI (SCREENING LOWER EXTREMITY)  Result Date: 02/20/2021 CLINICAL DATA:  Black right fifth toe post blunt trauma. Hypertension, diabetes. EXAM: NONINVASIVE PHYSIOLOGIC VASCULAR STUDY OF BILATERAL LOWER EXTREMITIES TECHNIQUE: Evaluation of both lower extremities were performed at rest, including calculation of ankle-brachial indices with single level Doppler, pressure and pulse volume recording. COMPARISON:  None. FINDINGS: Right ABI:  Non calculable due to vascular noncompressibility Left ABI:  Non calculable due to vascular noncompressibility Right Lower Extremity:  Normal arterial waveforms at the ankle. Left Lower Extremity:  Normal arterial waveforms at the ankle. Attenuated waveform in the right fourth digit.  IMPRESSION: Severely compromised study due to vascular noncompressibility, presumably related to medial calcifications in the setting of diabetes. Markedly attenuated arterial waveform in the right fourth digit. Electronically Signed   By: Corlis Leak M.D.   On: 02/20/2021 11:49   DG Foot Complete Right  Result Date: 02/17/2021 CLINICAL DATA:  Foot wound, swelling EXAM: RIGHT FOOT COMPLETE - 3+ VIEW COMPARISON:  None. FINDINGS: No acute bony abnormality. Specifically, no fracture,  subluxation, or dislocation. No bone destruction to suggest osteomyelitis. Joint spaces maintained. Diffuse vascular calcifications. IMPRESSION: No acute bony abnormality. Electronically Signed   By: Charlett Nose M.D.   On: 02/17/2021 18:40    Assessment/Plan Ashley Ellison is a 70 year old female with a past medical history of hypertension, diabetes, hyperlipidemia who presented to the ED with complaints of chronic wounds to the right foot.   1.  Gangrenous changes to the right toes: Patient presents with a progressively worsening nonhealing wound to the fifth toe on the right foot.  Early gangrenous changes to the fourth toe.  This was a result of trauma approximately 3 months ago.  Patient has multiple risk factors for atherosclerotic disease including hyperlipidemia and diabetes.  Recent ABIs were notable for being noncompressible.  Hard to palpate pedal pulses. In the setting of a nonhealing wound with multiple risk factors for atherosclerotic disease with noncompressible arteries on ABI recommend the patient undergo a right lower extremity angiogram with possible intervention and attempt to assess her anatomy and contributing degree of atherosclerotic disease.  If appropriate attempt to revascularize the leg can be made at that time.  Procedure, risk and benefits were explained to the patient.  All questions were answered.  Patient was to proceed.  We will plan on this tomorrow with Dr. Wyn Quaker.  2.  Diabetes: Patient  recently ran out of her hypoglycemics. Recently uncontrolled. Encouraged good control as its slows the progression of atherosclerotic disease.   3.  Hyperlipidemia: Diet controlled? Would consider the addition of aspirin and Lipitor for medical management. Encouraged good control as its slows the progression of atherosclerotic disease.   Discussed with Dr. Weldon Inches, PA-C 02/20/2021 3:55 PM  This note was created with Dragon medical transcription system.  Any error is purely unintentional.

## 2021-02-20 NOTE — TOC Progression Note (Signed)
Transition of Care [facility]) - Progression Note    Patient Details  Name: Ashley Ellison MRN: [id number] Date of Birth: 12-06-1950  [facility]) CM/SW Contact  Margarito Liner, LCSW Phone Number: 02/20/2021, 4:07 PM  Clinical Narrative: Per PT and OT, no needs. Per RN and podiatry, no home wound care needs.    Expected Discharge Plan: Home w Home Health Services Barriers to Discharge: Continued Medical Work up  Expected Discharge Plan and Services Expected Discharge Plan: Home w Home Health Services In-house Referral: Clinical Social Work   Post Acute Care Choice: Home Health Living arrangements for the past 2 months: Single Family Home                                       Social Determinants of Health (SDOH) Interventions    Readmission Risk Interventions No flowsheet data found.

## 2021-02-20 NOTE — Progress Notes (Signed)
PT Cancellation Note  Patient Details Name: Ashley Ellison MRN: [id number] DOB: 09-24-1950   Cancelled Treatment:    Reason Eval/Treat Not Completed: OT screened, no needs identified, will sign off. Order received, pt chart reviewed. OT evaluated this afternoon and has stated pt is independent with no needs. PT will sign off at this time. Please re-consult if status changes.    Basilia Jumbo PT, DPT 02/20/21 2:39 PM [phone]

## 2021-02-20 NOTE — Progress Notes (Signed)
Pharmacy Antibiotic Note  Ashley Ellison is a 70 y.o. female with history of hypertension, diabetes, not currently treated, presenting with R foot pain on 02/17/2021.  Pharmacy has been consulted for vancomycin dosing. Podiatry following and considering outpatient amputation pending if flow decent on studies.   Plan: Scr 0.8>0.87. Will adjust vancomycin 1750 mg IV q24h to 1500 mg IV q24h Est AUC: 488.4 Scr used: 0.87, Vd 0.7, IBW Assess renal function daily and adjust dose as clinically indicated Obtain vancomycin levels around steady state if continued Follow up cultures   Height: 5\' 7"  (170.2 cm) Weight: 81.6 kg (180 lb) IBW/kg (Calculated) : 61.6  Temp (24hrs), Avg:98.9 F (37.2 C), Min:98.6 F (37 C), Max:99.3 F (37.4 C)  Recent Labs  Lab 02/17/21 1436 02/18/21 0645 02/19/21 0435 02/20/21 0517  WBC 7.2 7.3 6.8 7.2  CREATININE 1.27* 0.74 0.80 0.87     Estimated Creatinine Clearance: 65.2 mL/min (by C-G formula based on SCr of 0.87 mg/dL).    No Known Allergies  Antimicrobials this admission: 10/15 ceftriaxone >>  10/15 vancomycin >>  Dose adjustments this admission: 10/18 Vanc 1750 mg IV q24> 1500 mg IV q24h   Microbiology results: 10/15 Bcx: NGTD 10/16 Bcx: NGTD  Thank you for allowing pharmacy to be a part of this patient's care.  11/16 Tiphanie Vo 02/20/2021 7:47 AM

## 2021-02-20 NOTE — Evaluation (Signed)
Occupational Therapy Evaluation Patient Details Name: Ashley Ellison MRN: [id number] DOB: [birth year] Today's Date: 02/20/2021   History of Present Illness Ashley Ellison is a 70 y.o. female with history of hypertension, hyperlipidemia, diabetes presented to hospital with right fifth toe swelling and redness.   Clinical Impression   Ashley Ellison was seen for OT evaluation this date. Prior to hospital admission, pt was Independent for mobility and I/ADLs including yardwork and recently teaching at [employer]. Pt lives with husband and daughter in level entry home. Pt instructed on ECS, modifying tasks, and home/routines modifications for IADLs. Currently pt demonstrates baseline independence to perform bed mobility, transfers, ADLs, and functional reach. No skilled OT needs identified. Will sign off. Please re-consult if additional OT needs arise.     Recommendations for follow up therapy are one component of a multi-disciplinary discharge planning process, led by the attending physician.  Recommendations may be updated based on patient status, additional functional criteria and insurance authorization.   Follow Up Recommendations  No OT follow up    Equipment Recommendations  None recommended by OT    Recommendations for Other Services       Precautions / Restrictions Precautions Precautions: None Restrictions Weight Bearing Restrictions: No Other Position/Activity Restrictions: per pt - MD instructed pt to heel walk      Mobility Bed Mobility Overal bed mobility: Independent                  Transfers Overall transfer level: Independent                    Balance Overall balance assessment: Independent                                         ADL either performed or assessed with clinical judgement   ADL Overall ADL's : Independent                                       General ADL Comments:  instructed on modifying tasks and home/routines modifications for IADLs      Pertinent Vitals/Pain Pain Assessment: No/denies pain     Hand Dominance Right   Extremity/Trunk Assessment Upper Extremity Assessment Upper Extremity Assessment: Overall WFL for tasks assessed   Lower Extremity Assessment Lower Extremity Assessment: Overall WFL for tasks assessed       Communication Communication Communication: No difficulties   Cognition Arousal/Alertness: Awake/alert Behavior During Therapy: WFL for tasks assessed/performed Overall Cognitive Status: Within Functional Limits for tasks assessed                                     General Comments       Exercises Exercises: Other exercises Other Exercises Other Exercises: Pt instructed in ECS, routines modifications, and adapted strategies for IADLs Other Exercises: bed mobility, sit<>stand, sitting/standing balance/tolerance, functional reach   Shoulder Instructions      Home Living Family/patient expects to be discharged to:: Private residence Living Arrangements: Spouse/significant other;Children Available Help at Discharge: Family;Available 24 hours/day Type of Home: House Home Access: Level entry (2 "drop offs" to get to mailbox, none for entry)     Home Layout: One level     Bathroom  Shower/Tub: Walk-in shower;Tub/shower unit   Bathroom Toilet: Handicapped height     Home Equipment: Environmental consultant - 2 wheels;Cane - single point;Shower seat;Grab bars - toilet;Grab bars - tub/shower   Additional Comments: pt's husband is "disabled veteran" that pt reports minimal physical needs, daughter available to assist if needed      Prior Functioning/Environment Level of Independence: Independent        Comments: 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 IND all mobility and I/ADLs including yard wokr        OT Problem List: Decreased activity tolerance          OT Goals(Current goals can be found in the care plan section) Acute Rehab OT Goals Patient Stated Goal: to go home OT Goal Formulation: With patient Time For Goal Achievement: 03/06/21 Potential to Achieve Goals: Good   AM-PAC OT "6 Clicks" Daily Activity     Outcome Measure Help from another person eating meals?: None Help from another person taking care of personal grooming?: None Help from another person toileting, which includes using toliet, bedpan, or urinal?: None Help from another person bathing (including washing, rinsing, drying)?: None Help from another person to put on and taking off regular upper body clothing?: None Help from another person to put on and taking off regular lower body clothing?: None 6 Click Score: 24   End of Session    Activity Tolerance: Patient tolerated treatment well Patient left: in bed;with call bell/phone within reach  OT Visit Diagnosis: Other abnormalities of gait and mobility (R26.89)                Time: 7106-2694 OT Time Calculation (min): 10 min Charges:  OT General Charges $OT Visit: 1 Visit OT Evaluation $OT Eval Low Complexity: 1 Low  Kathie Dike, M.S. OTR/L  02/20/21, 2:44 PM  ascom [phone]

## 2021-02-20 NOTE — Care Management Important Message (Signed)
Important Message  Patient Details  Name: Ashley Ellison MRN: [id number] Date of Birth: 1950/07/19   Medicare Important Message Given:  N/A - LOS <3 / Initial given by admissions     Johnell Comings 02/20/2021, 3:50 PM

## 2021-02-20 NOTE — Progress Notes (Signed)
PROGRESS NOTE    [name]  PYK:[id number] DOB: Jan 30, 1951 DOA: 02/17/2021 PCP: [name], MD   Brief Narrative:   Ashley Ellison is a 70 y.o. female with history of hypertension, hyperlipidemia, diabetes presented to hospital with right fifth toe swelling and redness.  Patient had an ulcer for few months but recently has been noticing more redness and pain and had been out of her diabetic medications for over 2 years.  Patient was then admitted hospital for further evaluation and treatment.  Assessment & Plan:   Principal Problem:   Diabetic foot ulcers (HCC) Active Problems:   Hypertension associated with diabetes (HCC)   AKI (acute kidney injury) (HCC)   Hyperlipidemia associated with type 2 diabetes mellitus (HCC)   Diabetic foot ulcer (HCC)   Gangrene of toe of right foot (HCC)  Diabetic foot ulcer: Currently on vancomycin and rocephin.  Patient was seen by podiatry who recommended vascular evaluation with ABI.  ABI was abnormal so vascular surgery has been consulted after discussing with podiatry.Marland Kitchen  Possibility of vascular ulcer.  MRI showed reactive osteitis rather than osteomyelitis.   Hemoglobin A1c of 10.3.  Used to be on metformin as outpatient. Will need to continue on discharge with focus on better glycemic control in the long-term.  Continue on sliding scale insulin while in the hospital.  Acute kidney injury, Resolved at this time.  Likely combination of medication and dehydration, lisinopril and metformin on hold.  Hypertension: Currently on hydralazine..  We will continue to monitor.  On lisinopril at home.  We will continue to hold now due to possible plan for angiogram  Hypokalemia.  Improved after replacement.   Hyperlipidemia: Continue Lipitor.  DVT prophylaxis: Heparin SQ   Code Status: FULL   Family Communication: Spoke with the patient  Disposition Plan:    Home  Consults called:  Podiatry ,vascular surgery  Admission  status: Inpatient due to IV antibiotics, need for vascular evaluation  Procedures:  None so far  Subjective: Today, patient was seen and examined at bedside.  Feels okay.  Denies overt pain nausea vomiting fever chills.  Objective: Vitals:   02/19/21 2021 02/20/21 0457 02/20/21 0750 02/20/21 1514  BP: (!) 152/62 (!) 184/71 (!) 146/59 (!) 141/63  Pulse: 93 80 89 92  Resp: 18 18 18 14   Temp: 99.3 F (37.4 C) 98.6 F (37 C) 98.7 F (37.1 C) 99 F (37.2 C)  TempSrc: Oral Oral Oral Oral  SpO2: 98% 97% 99% 99%  Weight:      Height:        Intake/Output Summary (Last 24 hours) at 02/20/2021 1525 Last data filed at 02/20/2021 1052 Gross per 24 hour  Intake 360 ml  Output --  Net 360 ml    Filed Weights   02/17/21 1434  Weight: 81.6 kg   Physical examination: General:  Average built, not in obvious distress HENT:   No scleral pallor or icterus noted. Oral mucosa is moist.  Chest:   Diminished breath sounds bilaterally. No crackles or wheezes.  CVS: S1 &S2 heard. No murmur.  Regular rate and rhythm. Abdomen: Soft, nontender, nondistended.  Bowel sounds are heard.   Extremities: No cyanosis, clubbing or edema.   Right fifth toe with dressing Psych: Alert, awake and oriented, normal mood CNS:  No cranial nerve deficits.  Power equal in all extremities.   Skin: Warm and dry.  No rashes noted.  Right fifth toe with dressing.  Data Reviewed: I have personally reviewed  the following labs and imaging studies.    CBC: Recent Labs  Lab 02/17/21 1436 02/18/21 0645 02/19/21 0435 02/20/21 0517  WBC 7.2 7.3 6.8 7.2  NEUTROABS  --   --  3.7  --   HGB 12.1 10.8* 10.6* 10.6*  HCT 34.0* 29.8* 29.8* 31.2*  MCV 92.4 90.9 91.4 91.2  PLT 268 237 236 260    Basic Metabolic Panel: Recent Labs  Lab 02/17/21 1436 02/18/21 0645 02/19/21 0435 02/20/21 0517  NA 136 138 138 140  K 3.6 3.3* 3.4* 3.7  CL 101 103 106 111  CO2 27 25 22 23   GLUCOSE 301* 175* 143* 133*  BUN 14 12 11  15   CREATININE 1.27* 0.74 0.80 0.87  CALCIUM 9.3 8.8* 8.5* 8.9    GFR: Estimated Creatinine Clearance: 65.2 mL/min (by C-G formula based on SCr of 0.87 mg/dL). Liver Function Tests: Recent Labs  Lab 02/18/21 0645  AST 15  ALT 12  ALKPHOS 36*  BILITOT 0.6  PROT 6.7  ALBUMIN 3.1*    No results for input(s): LIPASE, AMYLASE in the last 168 hours. No results for input(s): AMMONIA in the last 168 hours. Coagulation Profile: No results for input(s): INR, PROTIME in the last 168 hours. Cardiac Enzymes: No results for input(s): CKTOTAL, CKMB, CKMBINDEX, TROPONINI in the last 168 hours. BNP (last 3 results) No results for input(s): PROBNP in the last 8760 hours. HbA1C: Recent Labs    02/17/21 1840  HGBA1C 10.3*    CBG: Recent Labs  Lab 02/19/21 1215 02/19/21 1645 02/19/21 2031 02/20/21 0752 02/20/21 1140  GLUCAP 165* 154* 189* 161* 160*    Lipid Profile: No results for input(s): CHOL, HDL, LDLCALC, TRIG, CHOLHDL, LDLDIRECT in the last 72 hours.  Thyroid Function Tests: No results for input(s): TSH, T4TOTAL, FREET4, T3FREE, THYROIDAB in the last 72 hours. Anemia Panel: No results for input(s): VITAMINB12, FOLATE, FERRITIN, TIBC, IRON, RETICCTPCT in the last 72 hours. Sepsis Labs: No results for input(s): PROCALCITON, LATICACIDVEN in the last 168 hours.  Recent Results (from the past 240 hour(s))  Resp Panel by RT-PCR (Flu A&B, Covid) Nasopharyngeal Swab     Status: None   Collection Time: 02/17/21  9:15 AM   Specimen: Nasopharyngeal Swab; Nasopharyngeal(NP) swabs in vial transport medium  Result Value Ref Range Status   SARS Coronavirus 2 by RT PCR NEGATIVE NEGATIVE Final    Comment: (NOTE) SARS-CoV-2 target nucleic acids are NOT DETECTED.  The SARS-CoV-2 RNA is generally detectable in upper respiratory specimens during the acute phase of infection. The lowest concentration of SARS-CoV-2 viral copies this assay can detect is 138 copies/mL. A negative result  does not preclude SARS-Cov-2 infection and should not be used as the sole basis for treatment or other patient management decisions. A negative result may occur with  improper specimen collection/handling, submission of specimen other than nasopharyngeal swab, presence of viral mutation(s) within the areas targeted by this assay, and inadequate number of viral copies(<138 copies/mL). A negative result must be combined with clinical observations, patient history, and epidemiological information. The expected result is Negative.  Fact Sheet for Patients:  02/22/21  Fact Sheet for Healthcare Providers:  02/19/21  This test is no t yet approved or cleared by the [employer] FDA and  has been authorized for detection and/or diagnosis of SARS-CoV-2 by FDA under an Emergency Use Authorization (EUA). This EUA will remain  in effect (meaning this test can be used) for the duration of the COVID-19 declaration under Section 564(b)(1)  of the Act, 21 U.S.C.section 360bbb-3(b)(1), unless the authorization is terminated  or revoked sooner.       Influenza A by PCR NEGATIVE NEGATIVE Final   Influenza B by PCR NEGATIVE NEGATIVE Final    Comment: (NOTE) The Xpert Xpress SARS-CoV-2/FLU/RSV plus assay is intended as an aid in the diagnosis of influenza from Nasopharyngeal swab specimens and should not be used as a sole basis for treatment. Nasal washings and aspirates are unacceptable for Xpert Xpress SARS-CoV-2/FLU/RSV testing.  Fact Sheet for Patients: [employer]  Fact Sheet for Healthcare Providers: [employer]  This test is not yet approved or cleared by the [place] FDA and has been authorized for detection and/or diagnosis of SARS-CoV-2 by FDA under an Emergency Use Authorization (EUA). This EUA will remain in effect (meaning this test can be used) for  the duration of the COVID-19 declaration under Section 564(b)(1) of the Act, 21 U.S.C. section 360bbb-3(b)(1), unless the authorization is terminated or revoked.  Performed at [facility], [address] [place], [place] [address]   Blood culture (single)     Status: None (Preliminary result)   Collection Time: 02/17/21  6:40 PM   Specimen: BLOOD  Result Value Ref Range Status   Specimen Description BLOOD LEFT ANTECUBITAL  Final   Special Requests   Final    BOTTLES DRAWN AEROBIC AND ANAEROBIC Blood Culture results may not be optimal due to an excessive volume of blood received in culture bottles   Culture   Final    NO GROWTH 3 DAYS Performed at [facility], [address] [place], [place] [address]    Report Status PENDING  Incomplete  Blood Cultures x 2 sites     Status: None (Preliminary result)   Collection Time: 02/18/21  6:45 AM   Specimen: BLOOD RIGHT HAND  Result Value Ref Range Status   Specimen Description BLOOD RIGHT HAND  Final   Special Requests   Final    BOTTLES DRAWN AEROBIC AND ANAEROBIC Blood Culture adequate volume   Culture   Final    NO GROWTH 2 DAYS Performed at [facility], [address] [place], [place] [address]    Report Status PENDING  Incomplete  Blood Cultures x 2 sites     Status: None (Preliminary result)   Collection Time: 02/18/21  6:45 AM   Specimen: BLOOD LEFT HAND  Result Value Ref Range Status   Specimen Description BLOOD LEFT HAND  Final   Special Requests   Final    BOTTLES DRAWN AEROBIC AND ANAEROBIC Blood Culture results may not be optimal due to an excessive volume of blood received in culture bottles   Culture   Final    NO GROWTH 2 DAYS Performed at [facility], [address] [place], [place] [address]    Report Status PENDING  Incomplete     Radiology Studies: US ARTERIAL ABI (SCREENING LOWER EXTREMITY)  Result Date: 02/20/2021 CLINICAL DATA:  Black right fifth toe post  blunt trauma. Hypertension, diabetes. EXAM: NONINVASIVE PHYSIOLOGIC VASCULAR STUDY OF BILATERAL LOWER EXTREMITIES TECHNIQUE: Evaluation of both lower extremities were performed at rest, including calculation of ankle-brachial indices with single level Doppler, pressure and pulse volume recording. COMPARISON:  None. FINDINGS: Right ABI:  Non calculable due to vascular noncompressibility Left ABI:  Non calculable due to vascular noncompressibility Right Lower Extremity:  Normal arterial waveforms at the ankle. Left Lower Extremity:  Normal arterial waveforms at the ankle. Attenuated waveform in the right fourth digit.  IMPRESSION: Severely compromised study due to vascular noncompressibility, presumably related to medial calcifications in the setting of diabetes. Markedly attenuated arterial waveform in the right fourth digit. Electronically Signed   By: Corlis Leak M.D.   On: 02/20/2021 11:49     Scheduled Meds:  atorvastatin  10 mg Oral Daily   heparin  5,000 Units Subcutaneous Q8H   hydrALAZINE  25 mg Oral Q8H   insulin aspart  0-15 Units Subcutaneous TID WC   nutrition supplement (JUVEN)  1 packet Oral BID BM   Ensure Max Protein  11 oz Oral Daily   sodium chloride flush  3 mL Intravenous Q12H   Continuous Infusions:  cefTRIAXone (ROCEPHIN)  IV 2 g (02/19/21 1858)   vancomycin       LOS: 3 days   Joycelyn Das, MD [facility] If 7PM-7AM, please contact night-coverage 02/20/2021, 3:25 PM

## 2021-02-20 NOTE — Consult Note (Signed)
[facility] Vascular Consult Note  MRN : [id number]  Ashley Ellison is a 70 y.o. ([birth year]) female who presents with chief complaint of  Chief Complaint  Patient presents with   Foot Pain   History of Present Illness:  Ashley Ellison is a 70 year old female with a past medical history of hypertension, diabetes, hyperlipidemia who presented to the ED with complaints of chronic wounds to the right foot.   Patient states that this started few months ago and is slowly gotten worse.  She states that she dropped a cinder block 3 months ago.  She notes her discomfort has progressively worsened.  She states her daughter noticed the gangrenous changes to her toe as it is hard for her to see as far down as her feet.  Once her daughter noticed the gangrenous changes she sent her to our emergency department for further evaluation.  She denies fever, nausea vomiting.  Denies shortness of breath or chest pain. Patient denies any claudication-like symptoms or rest pain.  She has never been to a Physiological scientist.  Patient notes that she had recently stopped taking her hypoglycemic metformin as she ran out of this.  Vascular surgery was consulted by Dr. Tyson Babinski for possible vascular intervention in the setting of chronic wound to the fifth right toe.   Current Facility-Administered Medications  Medication Dose Route Frequency Provider Last Rate Last Admin   acetaminophen (TYLENOL) tablet 650 mg  650 mg Oral Q6H PRN Gertha Calkin, MD   650 mg at 02/18/21 0200   Or   acetaminophen (TYLENOL) suppository 650 mg  650 mg Rectal Q6H PRN Gertha Calkin, MD       atorvastatin (LIPITOR) tablet 10 mg  10 mg Oral Daily Marzetta Board, MD   10 mg at 02/20/21 0856   cefTRIAXone (ROCEPHIN) 2 g in sodium chloride 0.9 % 100 mL IVPB  2 g Intravenous Q24H Irena Cords V, MD 200 mL/hr at 02/19/21 1858 2 g at 02/19/21 1858   heparin injection 5,000 Units  5,000 Units Subcutaneous Q8H  Irena Cords V, MD   5,000 Units at 02/20/21 1435   hydrALAZINE (APRESOLINE) tablet 25 mg  25 mg Oral Q8H Irena Cords V, MD   25 mg at 02/20/21 1435   insulin aspart (novoLOG) injection 0-15 Units  0-15 Units Subcutaneous TID WC Gertha Calkin, MD   3 Units at 02/20/21 1153   morphine 2 MG/ML injection 2 mg  2 mg Intravenous Q8H PRN Gertha Calkin, MD   2 mg at 02/20/21 4098   nutrition supplement (JUVEN) (JUVEN) powder packet 1 packet  1 packet Oral BID BM Pokhrel, Laxman, MD   1 packet at 02/20/21 1441   protein supplement (ENSURE MAX) liquid  11 oz Oral Daily Pokhrel, Laxman, MD   11 oz at 02/20/21 0912   sodium chloride flush (NS) 0.9 % injection 3 mL  3 mL Intravenous Q12H Irena Cords V, MD   3 mL at 02/20/21 0907   vancomycin (VANCOREADY) IVPB 1500 mg/300 mL  1,500 mg Intravenous Q24H Rauer, Robyne Peers, RPH       Past Medical History:  Diagnosis Date   Diabetes mellitus without complication (HCC)    Hypertension    Past Surgical History:  Procedure Laterality Date   BACK SURGERY     VAGINAL DELIVERY     Social History Social History   Tobacco Use   Smoking status: Never   Smokeless tobacco: Never  Substance Use Topics   Alcohol use: No   Family History Family History  Problem Relation Age of Onset   Liver disease Mother   Denies family history of peripheral artery disease, disease or renal disease.  No Known Allergies  REVIEW OF SYSTEMS (Negative unless checked)  Constitutional: [] Weight loss  [] Fever  [] Chills Cardiac: [] Chest pain   [] Chest pressure   [] Palpitations   [] Shortness of breath when laying flat   [] Shortness of breath at rest   [] Shortness of breath with exertion. Vascular:  [] Pain in legs with walking   [] Pain in legs at rest   [] Pain in legs when laying flat   [] Claudication   [x] Pain in feet when walking  [x] Pain in feet at rest  [x] Pain in feet when laying flat   [] History of DVT   [] Phlebitis   [] Swelling in legs   [] Varicose veins   [x] Non-healing  ulcers Pulmonary:   [] Uses home oxygen   [] Productive cough   [] Hemoptysis   [] Wheeze  [] COPD   [] Asthma Neurologic:  [] Dizziness  [] Blackouts   [] Seizures   [] History of stroke   [] History of TIA  [] Aphasia   [] Temporary blindness   [] Dysphagia   [] Weakness or numbness in arms   [] Weakness or numbness in legs Musculoskeletal:  [] Arthritis   [] Joint swelling   [] Joint pain   [] Low back pain Hematologic:  [] Easy bruising  [] Easy bleeding   [] Hypercoagulable state   [] Anemic  [] Hepatitis Gastrointestinal:  [] Blood in stool   [] Vomiting blood  [] Gastroesophageal reflux/heartburn   [] Difficulty swallowing. Genitourinary:  [] Chronic kidney disease   [] Difficult urination  [] Frequent urination  [] Burning with urination   [] Blood in urine Skin:  [] Rashes   [x] Ulcers   [x] Wounds Psychological:  [] History of anxiety   []  History of major depression.  Physical Examination  Vitals:   02/20/21 0457 02/20/21 0750 02/20/21 1514 02/20/21 1533  BP: (!) 184/71 (!) 146/59 (!) 141/63 (!) 142/50  Pulse: 80 89 92 90  Resp: 18 18 14 18   Temp: 98.6 F (37 C) 98.7 F (37.1 C) 99 F (37.2 C) 99.3 F (37.4 C)  TempSrc: Oral Oral Oral Oral  SpO2: 97% 99% 99% 98%  Weight:      Height:       Body mass index is 28.19 kg/m. Gen:  WD/WN, NAD Head: [place]/AT, No temporalis wasting. Prominent temp pulse not noted. Ear/Nose/Throat: Hearing grossly intact, nares w/o erythema or drainage, oropharynx w/o Erythema/Exudate Eyes: Sclera non-icteric, conjunctiva clear Neck: Trachea midline.  No JVD.  Pulmonary:  Good air movement, respirations not labored, equal bilaterally.  Cardiac: RRR, normal S1, S2. Vascular:  Vessel Right Left  Radial Palpable Palpable  Ulnar Palpable Palpable  Brachial Palpable Palpable  Carotid Palpable, without bruit Palpable, without bruit  Aorta Not palpable N/A  Femoral Palpable Palpable  Popliteal Palpable Palpable  PT Non-Palpable Palpable  DP Faintly Palpable Palpable   Right Lower  Extremity: Thigh soft. Calf soft. Hard to palpate pedal pulses possibly a faint DP. Foot is warm. Dorsal sensory is intact.  Early gangrenous changes to the fourth toe.  Gangrenous changes to the fifth toe.  Gastrointestinal: soft, non-tender/non-distended. No guarding/reflex.  Musculoskeletal: M/S 5/5 throughout.  Extremities without ischemic changes.  No deformity or atrophy. No edema. Neurologic: Sensation grossly intact in extremities.  Symmetrical.  Speech is fluent. Motor exam as listed above. Psychiatric: Judgment intact, Mood & affect appropriate for pt's clinical situation. Dermatologic: As above  Lymph : No Cervical, Axillary, or Inguinal lymphadenopathy.  CBC Lab Results  Component Value Date   WBC 7.2 02/20/2021   HGB 10.6 (L) 02/20/2021   HCT 31.2 (L) 02/20/2021   MCV 91.2 02/20/2021   PLT 260 02/20/2021   BMET    Component Value Date/Time   NA 140 02/20/2021 0517   NA 135 (L) 03/10/2013 2008   K 3.7 02/20/2021 0517   K 4.2 03/10/2013 2008   CL 111 02/20/2021 0517   CL 101 03/10/2013 2008   CO2 23 02/20/2021 0517   CO2 24 03/10/2013 2008   GLUCOSE 133 (H) 02/20/2021 0517   GLUCOSE 327 (H) 03/10/2013 2008   BUN 15 02/20/2021 0517   BUN 8 03/10/2013 2008   CREATININE 0.87 02/20/2021 0517   CREATININE 0.88 03/10/2013 2008   CALCIUM 8.9 02/20/2021 0517   CALCIUM 10.3 (H) 03/10/2013 2008   GFRNONAA >60 02/20/2021 0517   GFRNONAA >60 03/10/2013 2008   GFRAA >60 03/20/2019 1858   GFRAA >60 03/10/2013 2008   Estimated Creatinine Clearance: 65.2 mL/min (by C-G formula based on SCr of 0.87 mg/dL).  COAG Lab Results  Component Value Date   INR 1.0 03/18/2019   Radiology MR FOOT RIGHT WO CONTRAST  Result Date: 02/18/2021 CLINICAL DATA:  Foot swelling, diabetic, osteomyelitis suspected, xray done EXAM: MRI OF THE RIGHT FOREFOOT WITHOUT CONTRAST TECHNIQUE: Multiplanar, multisequence MR imaging of the right forefoot was performed. No intravenous contrast was  administered. COMPARISON:  X-ray 02/17/2021 FINDINGS: Bones/Joint/Cartilage Bone marrow edema within the middle phalanx and distal phalanx of the right fifth toe without confluent low T1 bone marrow signal (series 10, image 5). No cortical erosion. Marrow signal of the remaining forefoot is within normal limits. No fracture. No dislocation. Mild osteoarthritis of the forefoot, most pronounced at the first MTP joint. Ligaments Intact Lisfranc ligament. Collateral ligaments of the forefoot appear intact. Muscles and Tendons Denervation changes of the intrinsic foot musculature. Intact flexor and extensor tendons. No tenosynovitis. Soft tissues Generalized soft tissue edema. No organized fluid collection. No discernible ulceration. IMPRESSION: 1. Bone marrow edema within the middle phalanx and distal phalanx of the right fifth toe without confluent low T1 bone marrow signal. 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 2. Generalized soft tissue edema. No organized fluid collection. 3. Mild osteoarthritis of the forefoot, most pronounced at the first MTP joint. Electronically Signed   By: Duanne Guess D.O.   On: 02/18/2021 09:47   US ARTERIAL ABI (SCREENING LOWER EXTREMITY)  Result Date: 02/20/2021 CLINICAL DATA:  Black right fifth toe post blunt trauma. Hypertension, diabetes. EXAM: NONINVASIVE PHYSIOLOGIC VASCULAR STUDY OF BILATERAL LOWER EXTREMITIES TECHNIQUE: Evaluation of both lower extremities were performed at rest, including calculation of ankle-brachial indices with single level Doppler, pressure and pulse volume recording. COMPARISON:  None. FINDINGS: Right ABI:  Non calculable due to vascular noncompressibility Left ABI:  Non calculable due to vascular noncompressibility Right Lower Extremity:  Normal arterial waveforms at the ankle. Left Lower Extremity:  Normal arterial waveforms at the ankle. Attenuated waveform in the right fourth digit.  IMPRESSION: Severely compromised study due to vascular noncompressibility, presumably related to medial calcifications in the setting of diabetes. Markedly attenuated arterial waveform in the right fourth digit. Electronically Signed   By: Corlis Leak M.D.   On: 02/20/2021 11:49   DG Foot Complete Right  Result Date: 02/17/2021 CLINICAL DATA:  Foot wound, swelling EXAM: RIGHT FOOT COMPLETE - 3+ VIEW COMPARISON:  None. FINDINGS: No acute bony abnormality. Specifically, no fracture,  subluxation, or dislocation. No bone destruction to suggest osteomyelitis. Joint spaces maintained. Diffuse vascular calcifications. IMPRESSION: No acute bony abnormality. Electronically Signed   By: Charlett Nose M.D.   On: 02/17/2021 18:40    Assessment/Plan Ashley Ellison is a 70 year old female with a past medical history of hypertension, diabetes, hyperlipidemia who presented to the ED with complaints of chronic wounds to the right foot.   1.  Gangrenous changes to the right toes: Patient presents with a progressively worsening nonhealing wound to the fifth toe on the right foot.  Early gangrenous changes to the fourth toe.  This was a result of trauma approximately 3 months ago.  Patient has multiple risk factors for atherosclerotic disease including hyperlipidemia and diabetes.  Recent ABIs were notable for being noncompressible.  Hard to palpate pedal pulses. In the setting of a nonhealing wound with multiple risk factors for atherosclerotic disease with noncompressible arteries on ABI recommend the patient undergo a right lower extremity angiogram with possible intervention and attempt to assess her anatomy and contributing degree of atherosclerotic disease.  If appropriate attempt to revascularize the leg can be made at that time.  Procedure, risk and benefits were explained to the patient.  All questions were answered.  Patient was to proceed.  We will plan on this tomorrow with Dr. Wyn Quaker.  2.  Diabetes: Patient  recently ran out of her hypoglycemics. Recently uncontrolled. Encouraged good control as its slows the progression of atherosclerotic disease.   3.  Hyperlipidemia: Diet controlled? Would consider the addition of aspirin and Lipitor for medical management. Encouraged good control as its slows the progression of atherosclerotic disease.   Discussed with Dr. Weldon Inches, PA-C 02/20/2021 3:55 PM  This note was created with Dragon medical transcription system.  Any error is purely unintentional.

## 2021-02-20 NOTE — Progress Notes (Signed)
   02/20/21 1350  Clinical Encounter Type  Visited With Patient  Visit Type Follow-up;Spiritual support;Social support  Referral From Chaplain  Consult/Referral To [facility] did a FU visit with PT. PT was able to express her emotions and concerns. PT was a little nervous and voiced her concerns regarding a pending testing results. Chaplain ministered compassionate presence, reflective listening, and prayer as requested.  Posey Boyer, M. Div.

## 2021-02-21 ENCOUNTER — Encounter
Admission: EM | Disposition: A | Discharge status: Home Health | Payer: Self-pay | Source: Home / Self Care | Attending: Internal Medicine

## 2021-02-21 DIAGNOSIS — I70235 Atherosclerosis of native arteries of right leg with ulceration of other part of foot: Secondary | ICD-10-CM

## 2021-02-21 DIAGNOSIS — I739 Peripheral vascular disease, unspecified: Secondary | ICD-10-CM

## 2021-02-21 DIAGNOSIS — L97529 Non-pressure chronic ulcer of other part of left foot with unspecified severity: Secondary | ICD-10-CM

## 2021-02-21 HISTORY — PX: LOWER EXTREMITY ANGIOGRAPHY: CATH118251

## 2021-02-21 LAB — GLUCOSE, CAPILLARY
Glucose-Capillary: 173 mg/dL — ABNORMAL HIGH (ref 70–99)
Glucose-Capillary: 132 mg/dL — ABNORMAL HIGH (ref 70–99)
Glucose-Capillary: 136 mg/dL — ABNORMAL HIGH (ref 70–99)
Glucose-Capillary: 108 mg/dL — ABNORMAL HIGH (ref 70–99)
Glucose-Capillary: 150 mg/dL — ABNORMAL HIGH (ref 70–99)

## 2021-02-21 LAB — BASIC METABOLIC PANEL
Anion gap: 3 — ABNORMAL LOW (ref 5–15)
BUN: 19 mg/dL (ref 8–23)
CO2: 24 mmol/L (ref 22–32)
Calcium: 9.1 mg/dL (ref 8.9–10.3)
Chloride: 110 mmol/L (ref 98–111)
Creatinine, Ser: 0.85 mg/dL (ref 0.44–1.00)
GFR, Estimated: >60 mL/min (ref >60)
Glucose, Bld: 140 mg/dL — ABNORMAL HIGH (ref 70–99)
Potassium: 3.6 mmol/L (ref 3.5–5.1)
Sodium: 137 mmol/L (ref 135–145)

## 2021-02-21 SURGERY — LOWER EXTREMITY ANGIOGRAPHY
Anesthesia: Moderate Sedation | Laterality: Right

## 2021-02-21 MED ORDER — MIDAZOLAM HCL 2 MG/2ML IJ SOLN
INTRAMUSCULAR | Status: AC
  Filled 2021-02-21: qty 2

## 2021-02-21 MED ORDER — DIPHENHYDRAMINE HCL 50 MG/ML IJ SOLN: 50.0000 mg | Freq: Once | INTRAMUSCULAR | Status: DC | PRN

## 2021-02-21 MED ORDER — FENTANYL CITRATE (PF) 100 MCG/2ML IJ SOLN
INTRAMUSCULAR | Status: AC
  Filled 2021-02-21: qty 2

## 2021-02-21 MED ORDER — CEFAZOLIN SODIUM-DEXTROSE 2-4 GM/100ML-% IV SOLN: 2.0000 g | INTRAVENOUS | Status: AC

## 2021-02-21 MED ORDER — NITROGLYCERIN 1 MG/10 ML FOR IR/CATH LAB
INTRA_ARTERIAL | Status: DC | PRN
  Administered 2021-02-21 (×3): 200 ug via INTRA_ARTERIAL

## 2021-02-21 MED ORDER — IODIXANOL 320 MG/ML IV SOLN
INTRAVENOUS | Status: DC | PRN
  Administered 2021-02-21: 50 mL

## 2021-02-21 MED ORDER — NITROGLYCERIN 1 MG/10 ML FOR IR/CATH LAB
INTRA_ARTERIAL | Status: AC
  Filled 2021-02-21: qty 10

## 2021-02-21 MED ORDER — CEFAZOLIN SODIUM-DEXTROSE 2-4 GM/100ML-% IV SOLN
INTRAVENOUS | Status: AC
  Administered 2021-02-21: 2 g via INTRAVENOUS
  Filled 2021-02-21: qty 100

## 2021-02-21 MED ORDER — MIDAZOLAM HCL 2 MG/2ML IJ SOLN
INTRAMUSCULAR | Status: DC | PRN
  Administered 2021-02-21: 2 mg via INTRAVENOUS

## 2021-02-21 MED ORDER — ASPIRIN EC 81 MG PO TBEC
81.0000 mg | DELAYED_RELEASE_TABLET | Freq: Every day | ORAL | Status: DC
  Administered 2021-02-22: 81 mg via ORAL
  Filled 2021-02-21: qty 1

## 2021-02-21 MED ORDER — HEPARIN SODIUM (PORCINE) 1000 UNIT/ML IJ SOLN
INTRAMUSCULAR | Status: AC
  Filled 2021-02-21: qty 1

## 2021-02-21 MED ORDER — HYDROMORPHONE HCL 1 MG/ML IJ SOLN: 1.0000 mg | Freq: Once | INTRAMUSCULAR | Status: DC | PRN

## 2021-02-21 MED ORDER — MIDAZOLAM HCL 2 MG/ML PO SYRP: 8.0000 mg | ORAL_SOLUTION | Freq: Once | ORAL | Status: DC | PRN

## 2021-02-21 MED ORDER — SODIUM CHLORIDE 0.9 % IV SOLN: INTRAVENOUS | Status: DC

## 2021-02-21 MED ORDER — FENTANYL CITRATE (PF) 100 MCG/2ML IJ SOLN
INTRAMUSCULAR | Status: DC | PRN
  Administered 2021-02-21: 50 ug via INTRAVENOUS

## 2021-02-21 MED ORDER — ONDANSETRON HCL 4 MG/2ML IJ SOLN: 4.0000 mg | Freq: Four times a day (QID) | INTRAMUSCULAR | Status: DC | PRN

## 2021-02-21 MED ORDER — CLOPIDOGREL BISULFATE 75 MG PO TABS
75.0000 mg | ORAL_TABLET | Freq: Every day | ORAL | Status: DC
  Administered 2021-02-22: 75 mg via ORAL
  Filled 2021-02-21: qty 1

## 2021-02-21 MED ORDER — FAMOTIDINE 20 MG PO TABS: 40.0000 mg | ORAL_TABLET | Freq: Once | ORAL | Status: DC | PRN

## 2021-02-21 MED ORDER — METHYLPREDNISOLONE SODIUM SUCC 125 MG IJ SOLR: 125.0000 mg | Freq: Once | INTRAMUSCULAR | Status: DC | PRN

## 2021-02-21 SURGICAL SUPPLY — 21 items
BALLN ARMADA 2.5X200X150 (BALLOONS) ×1
BALLN ARMADA 2.5X200X150X (BALLOONS) ×1
BALLN ARMADA 2X100X150 (BALLOONS) ×2
BALLOON ARMADA 2.5X200X150X (BALLOONS) IMPLANT
BALLOON ARMADA 2X100X150 (BALLOONS) IMPLANT
CANNULA 5F STIFF (CANNULA) ×1 IMPLANT
CATH ANGIO 5F PIGTAIL 65CM (CATHETERS) ×1 IMPLANT
CATH SEEKER .035X150CM (CATHETERS) ×1 IMPLANT
CATH TEMPO 5F RIM 65CM (CATHETERS) ×1 IMPLANT
COVER PROBE U/S 5X48 (MISCELLANEOUS) ×1 IMPLANT
DEVICE STARCLOSE SE CLOSURE (Vascular Products) ×1 IMPLANT
GLIDEWIRE ANGLED SS 035X260CM (WIRE) ×1 IMPLANT
GOWN SRG XL LVL 3 NONREINFORCE (GOWNS) IMPLANT
GOWN STRL NON-REIN TWL XL LVL3 (GOWNS) ×1
KIT ENCORE 26 ADVANTAGE (KITS) ×1 IMPLANT
PACK ANGIOGRAPHY (CUSTOM PROCEDURE TRAY) ×2 IMPLANT
SHEATH ANL 5FRX90 (SHEATH) ×1 IMPLANT
SHEATH BRITE TIP 5FRX11 (SHEATH) ×1 IMPLANT
TUBING CONTRAST HIGH PRESS 72 (TUBING) ×1 IMPLANT
WIRE G V18X300CM (WIRE) ×1 IMPLANT
WIRE GUIDERIGHT .035X150 (WIRE) ×1 IMPLANT

## 2021-02-21 NOTE — Progress Notes (Signed)
  Subjective:  Patient ID: Ashley Ellison, female    DOB: 1950-09-22,  MRN: [id number]   Seen at bedside lying in bed resting says she feels well after her procedure today  Negative for chest pain and shortness of breath Review of all other systems is negative Objective:   Vitals:   02/21/21 1542 02/21/21 1627  BP: 126/68 (!) 158/69  Pulse: 86 87  Resp: 20 20  Temp: 99.3 F (37.4 C) 98.3 F (36.8 C)  SpO2: 100% 99%   General AA&O x3. Normal mood and affect.  Vascular Palpable DP pulse, Non palp PT pulse  Neurologic Epicritic sensation grossly intact.  Dermatologic Gangrenous changes 5th toe stable  Orthopedic: MMT 5/5 in dorsiflexion, plantarflexion, inversion, and eversion. Normal joint ROM without pain or crepitus.   Op note from vascular surgery notes 80-90% stenosis of the PT and AT both underwent angioplasty with improvement in flow throughout the foot Assessment & Plan:  Patient was evaluated and treated and all questions answered.  R 5th toe gangrene -Doing well after procedure today -Dr. Allena Katz will be by to round on the patient tomorrow and evaluate the toe.  If the toe is stable and similar to appearance pre-revascularization then I think we can discharge her and monitor changes in the hope that it heals.  If she needs an amputation we can do it as an outpatient that point.  If it looks worse tomorrow, will plan for amputation as inpatient on Friday with me   Edwin Cap, DPM  Accessible via secure chat for questions or concerns.

## 2021-02-21 NOTE — Interval H&P Note (Signed)
History and Physical Interval Note:  02/21/2021 10:22 AM  Ashley Ellison  has presented today for surgery, with the diagnosis of Atherosclerotic disease with gangrene.  The various methods of treatment have been discussed with the patient and family. After consideration of risks, benefits and other options for treatment, the patient has consented to  Procedure(s): Lower Extremity Angiography (Right) as a surgical intervention.  The patient's history has been reviewed, patient examined, no change in status, stable for surgery.  I have reviewed the patient's chart and labs.  Questions were answered to the patient's satisfaction.     Festus Barren

## 2021-02-21 NOTE — Progress Notes (Signed)
PROGRESS NOTE    Ashley LYSNE  Ellison:[id number] DOB: 1951/01/05 DOA: 02/17/2021 PCP: [name], MD   Right fifth toe gangrene. Brief Narrative:  Ashley Ellison is a 70 y.o. female with history of hypertension, hyperlipidemia, diabetes presented to hospital with right fifth toe swelling and redness.  Patient was initially placed on vancomycin and Rocephin.  MRI showed reactive osteitis without osteomyelitis. Patient had angiogram performed on 10/19, had a balloon dilation into right anterior tibial artery, proximal dorsalis pedis artery, and right distal posterior tibial artery.   Assessment & Plan:   Principal Problem:   Diabetic foot ulcers (HCC) Active Problems:   Hypertension associated with diabetes (HCC)   AKI (acute kidney injury) (HCC)   Hyperlipidemia associated with type 2 diabetes mellitus (HCC)   Diabetic foot ulcer (HCC)   Gangrene of toe of right foot (HCC)  Right fifth toe dry gangrene. Peripheral arterial disease at the right lower extremity. Patient is status post balloon dilation. Patient does not seem to have significant bacterial infection.  I will discontinue vancomycin, continue Rocephin for 1 more day, plan to transition to oral Augmentin for additional 3 days. Patient probably can go home tomorrow.  Acute kidney injury. Hypokalemia. Recheck BMP tomorrow, renal function normalized.   Essential hypertension  Continue hydralazine.      DVT prophylaxis: Heparin Code Status: full Family Communication:  Disposition Plan:    Status is: Inpatient  Remains inpatient appropriate because: Severity of disease and vascular procedure        I/O last 3 completed shifts: In: 600 [P.O.:600] Out: -  No intake/output data recorded.     Consultants:  Vascular surgery  Procedures: Balloon dilation of peripheral arterial disease.  Antimicrobials: Rocephin. Subjective: Doing well, currently denies any nausea vomiting. No  abdominal pain or diarrhea. No short of breath or cough. No fever chills pain No dysuria or hematuria.  Objective: Vitals:   02/21/21 1400 02/21/21 1421 02/21/21 1430 02/21/21 1445  BP:  (!) 161/67 127/69 (!) 144/59  Pulse:  92 89 86  Resp:  (!) 21 (!) 23 18  Temp:      TempSrc:      SpO2: 99% 99% 99% 99%  Weight:      Height:        Intake/Output Summary (Last 24 hours) at 02/21/2021 1457 Last data filed at 02/20/2021 1800 Gross per 24 hour  Intake 240 ml  Output --  Net 240 ml   Filed Weights   02/17/21 1434  Weight: 81.6 kg    Examination:  General exam: Appears calm and comfortable  Respiratory system: Clear to auscultation. Respiratory effort normal. Cardiovascular system: S1 & S2 heard, RRR. No JVD, murmurs, rubs, gallops or clicks. No pedal edema. Gastrointestinal system: Abdomen is nondistended, soft and nontender. No organomegaly or masses felt. Normal bowel sounds heard. Central nervous system: Alert and oriented. No focal neurological deficits. Extremities: Right fifth toe dry gangrene Skin: No rashes, lesions or ulcers Psychiatry: Judgement and insight appear normal. Mood & affect appropriate.     Data Reviewed: I have personally reviewed following labs and imaging studies  CBC: Recent Labs  Lab 02/17/21 1436 02/18/21 0645 02/19/21 0435 02/20/21 0517  WBC 7.2 7.3 6.8 7.2  NEUTROABS  --   --  3.7  --   HGB 12.1 10.8* 10.6* 10.6*  HCT 34.0* 29.8* 29.8* 31.2*  MCV 92.4 90.9 91.4 91.2  PLT 268 237 236 260   Basic Metabolic Panel: Recent Labs  Lab  02/17/21 1436 02/18/21 0645 02/19/21 0435 02/20/21 0517 02/21/21 0328  NA 136 138 138 140 137  K 3.6 3.3* 3.4* 3.7 3.6  CL 101 103 106 111 110  CO2 27 25 22 23 24   GLUCOSE 301* 175* 143* 133* 140*  BUN 14 12 11 15 19   CREATININE 1.27* 0.74 0.80 0.87 0.85  CALCIUM 9.3 8.8* 8.5* 8.9 9.1   GFR: Estimated Creatinine Clearance: 66.7 mL/min (by C-G formula based on SCr of 0.85 mg/dL). Liver  Function Tests: Recent Labs  Lab 02/18/21 0645  AST 15  ALT 12  ALKPHOS 36*  BILITOT 0.6  PROT 6.7  ALBUMIN 3.1*   No results for input(s): LIPASE, AMYLASE in the last 168 hours. No results for input(s): AMMONIA in the last 168 hours. Coagulation Profile: No results for input(s): INR, PROTIME in the last 168 hours. Cardiac Enzymes: No results for input(s): CKTOTAL, CKMB, CKMBINDEX, TROPONINI in the last 168 hours. BNP (last 3 results) No results for input(s): PROBNP in the last 8760 hours. HbA1C: No results for input(s): HGBA1C in the last 72 hours. CBG: Recent Labs  Lab 02/20/21 1700 02/20/21 2148 02/21/21 0814 02/21/21 1003 02/21/21 1435  GLUCAP 164* 147* 173* 132* 136*   Lipid Profile: No results for input(s): CHOL, HDL, LDLCALC, TRIG, CHOLHDL, LDLDIRECT in the last 72 hours. Thyroid Function Tests: No results for input(s): TSH, T4TOTAL, FREET4, T3FREE, THYROIDAB in the last 72 hours. Anemia Panel: No results for input(s): VITAMINB12, FOLATE, FERRITIN, TIBC, IRON, RETICCTPCT in the last 72 hours. Sepsis Labs: No results for input(s): PROCALCITON, LATICACIDVEN in the last 168 hours.  Recent Results (from the past 240 hour(s))  Resp Panel by RT-PCR (Flu A&B, Covid) Nasopharyngeal Swab     Status: None   Collection Time: 02/17/21  9:15 AM   Specimen: Nasopharyngeal Swab; Nasopharyngeal(NP) swabs in vial transport medium  Result Value Ref Range Status   SARS Coronavirus 2 by RT PCR NEGATIVE NEGATIVE Final    Comment: (NOTE) SARS-CoV-2 target nucleic acids are NOT DETECTED.  The SARS-CoV-2 RNA is generally detectable in upper respiratory specimens during the acute phase of infection. The lowest concentration of SARS-CoV-2 viral copies this assay can detect is 138 copies/mL. A negative result does not preclude SARS-Cov-2 infection and should not be used as the sole basis for treatment or other patient management decisions. A negative result may occur with  improper  specimen collection/handling, submission of specimen other than nasopharyngeal swab, presence of viral mutation(s) within the areas targeted by this assay, and inadequate number of viral copies(<138 copies/mL). A negative result must be combined with clinical observations, patient history, and epidemiological information. The expected result is Negative.  Fact Sheet for Patients:  02/23/21  Fact Sheet for Healthcare Providers:  02/19/21  This test is no t yet approved or cleared by the [employer] FDA and  has been authorized for detection and/or diagnosis of SARS-CoV-2 by FDA under an Emergency Use Authorization (EUA). This EUA will remain  in effect (meaning this test can be used) for the duration of the COVID-19 declaration under Section 564(b)(1) of the Act, 21 U.S.C.section 360bbb-3(b)(1), unless the authorization is terminated  or revoked sooner.       Influenza A by PCR NEGATIVE NEGATIVE Final   Influenza B by PCR NEGATIVE NEGATIVE Final    Comment: (NOTE) The Xpert Xpress SARS-CoV-2/FLU/RSV plus assay is intended as an aid in the diagnosis of influenza from Nasopharyngeal swab specimens and should not be used as a sole basis  for treatment. Nasal washings and aspirates are unacceptable for Xpert Xpress SARS-CoV-2/FLU/RSV testing.  Fact Sheet for Patients: [employer]  Fact Sheet for Healthcare Providers: [employer]ious[employer]ker[employer]it  This test is not yet approved or cleared by the [place] FDA and has been authorized for detection and/or diagnosis of SARS-CoV-2 by FDA under an Emergency Use Authorization (EUA). This EUA will remain in effect (meaning this test can be used) for the duration of the COVID-19 declaration under Section 564(b)(1) of the Act, 21 U.S.C. section 360bbb-3(b)(1), unless the authorization is terminated or revoked.  Performed at  [facility], [address] [place], [place] [address]   Blood culture (single)     Status: None (Preliminary result)   Collection Time: 02/17/21  6:40 PM   Specimen: BLOOD  Result Value Ref Range Status   Specimen Description BLOOD LEFT ANTECUBITAL  Final   Special Requests   Final    BOTTLES DRAWN AEROBIC AND ANAEROBIC Blood Culture results may not be optimal due to an excessive volume of blood received in culture bottles   Culture   Final    NO GROWTH 4 DAYS Performed at [facility], [address] [place], [place] [address]    Report Status PENDING  Incomplete  Blood Cultures x 2 sites     Status: None (Preliminary result)   Collection Time: 02/18/21  6:45 AM   Specimen: BLOOD RIGHT HAND  Result Value Ref Range Status   Specimen Description BLOOD RIGHT HAND  Final   Special Requests   Final    BOTTLES DRAWN AEROBIC AND ANAEROBIC Blood Culture adequate volume   Culture   Final    NO GROWTH 3 DAYS Performed at [facility], [address], [place], [place] [address]    Report Status PENDING  Incomplete  Blood Cultures x 2 sites     Status: None (Preliminary result)   Collection Time: 02/18/21  6:45 AM   Specimen: BLOOD LEFT HAND  Result Value Ref Range Status   Specimen Description BLOOD LEFT HAND  Final   Special Requests   Final    BOTTLES DRAWN AEROBIC AND ANAEROBIC Blood Culture results may not be optimal due to an excessive volume of blood received in culture bottles   Culture   Final    NO GROWTH 3 DAYS Performed at [facility], [address], [place], [place] [address]    Report Status PENDING  Incomplete         Radiology Studies: PERIPHERAL VASCULAR CATHETERIZATION  Result Date: 02/21/2021 See surgical note for result.  US ARTERIAL ABI (SCREENING LOWER EXTREMITY)  Result Date: 02/20/2021 CLINICAL DATA:  Black right fifth toe post blunt trauma. Hypertension, diabetes. EXAM: NONINVASIVE PHYSIOLOGIC VASCULAR  STUDY OF BILATERAL LOWER EXTREMITIES TECHNIQUE: Evaluation of both lower extremities were performed at rest, including calculation of ankle-brachial indices with single level Doppler, pressure and pulse volume recording. COMPARISON:  None. FINDINGS: Right ABI:  Non calculable due to vascular noncompressibility Left ABI:  Non calculable due to vascular noncompressibility Right Lower Extremity:  Normal arterial waveforms at the ankle. Left Lower Extremity:  Normal arterial waveforms at the ankle. Attenuated waveform in the right fourth digit. IMPRESSION: Severely compromised study due to vascular noncompressibility, presumably related to medial calcifications in the setting of diabetes. Markedly attenuated arterial waveform in the right fourth digit. Electronically Signed   By: Corlis Leak M.D.   On: 02/20/2021 11:49        Scheduled Meds:  [MAR Hold] atorvastatin  10 mg Oral Daily   fentaNYL       [MAR Hold] heparin  5,000 Units Subcutaneous Q8H   heparin sodium (porcine)       [MAR Hold] hydrALAZINE  25 mg Oral Q8H   [MAR Hold] insulin aspart  0-15 Units Subcutaneous TID WC   midazolam       [MAR Hold] nutrition supplement (JUVEN)  1 packet Oral BID BM   [MAR Hold] Ensure Max Protein  11 oz Oral Daily   [MAR Hold] sodium chloride flush  3 mL Intravenous Q12H   Continuous Infusions:  sodium chloride 75 mL/hr at 02/21/21 0630   sodium chloride     [MAR Hold] cefTRIAXone (ROCEPHIN)  IV 2 g (02/20/21 2031)     LOS: 4 days    Time spent: 27 minutes    Marrion Coy, MD [facility]   To contact the attending provider between 7A-7P or the covering provider during after hours 7P-7A, please log into the web site [email or web address] and access using universal [place] password for that web site. If you do not have the password, please call the hospital operator.  02/21/2021, 2:57 PM

## 2021-02-21 NOTE — Op Note (Signed)
[facility]  Percutaneous Study/Intervention Procedural Note   Date of Surgery: 02/21/2021  Surgeon(s): Leotis Pain, MD  Assistants: Elmore Guise, MD  Pre-operative Diagnosis: PAD with ulceration right lower extremity  Post-operative diagnosis:  Same  Procedure(s) Performed:             1.  Ultrasound guidance for vascular access left femoral artery             2.  Catheter placement into right common femoral artery from left femoral approach             3.  Aortogram and selective right lower extremity angiogram             4.  Percutaneous transluminal angioplasty of the right anterior tibial artery and proximal dorsalis pedis artery with 2.5 mm diameter angioplasty balloon             5.  Percutaneous transluminal angioplasty of the right distal posterior tibial artery with a 2 mm diameter angioplasty balloon  6.  StarClose closure device left femoral artery  EBL: 5 cc  Contrast: 50 cc  Fluoro Time: 10.7 minutes  Moderate Conscious Sedation Time: approximately 57 minutes using 2 mg of Versed and 50 mcg of Fentanyl              Indications:  Patient is a 70 y.o.female with nonhealing ulceration of the right foot. The patient has noninvasive study showing noncompressible vessels. The patient is brought in for angiography for further evaluation and potential treatment.  Due to the limb threatening nature of the situation, angiogram was performed for attempted limb salvage. The patient is aware that if the procedure fails, amputation would be expected.  The patient also understands that even with successful revascularization, amputation may still be required due to the severity of the situation.  Risks and benefits are discussed and informed consent is obtained.   Procedure:  The patient was identified and appropriate procedural time out was performed.  The patient was then placed supine on the table and prepped and draped in the usual sterile fashion. Moderate  conscious sedation was administered during a face to face encounter with the patient throughout the procedure with my supervision of the RN administering medicines and monitoring the patient's vital signs, pulse oximetry, telemetry and mental status throughout from the start of the procedure until the patient was taken to the recovery room. Ultrasound was used to evaluate the left common femoral artery.  It was patent .  A digital ultrasound image was acquired.  A Seldinger needle was used to access the left common femoral artery under direct ultrasound guidance and a permanent image was performed.  A 0.035 J wire was advanced without resistance and a 5Fr sheath was placed.  Pigtail catheter was placed into the aorta and an AP aortogram was performed. This demonstrated normal renal arteries and normal aorta and iliac segments without significant stenosis. I then crossed the aortic bifurcation and advanced to the right femoral head. Selective right lower extremity angiogram was then performed. This demonstrated normal common femoral artery, profunda femoris artery, superficial femoral artery, and popliteal artery.  There is a typical tibial trifurcation.  All 3 tibial vessels are patent proximally.  The peroneal artery was small and had its typical course which terminated at the ankle.  The posterior tibial artery had a high-grade stenosis in the 80% range at the level of the ankle and the anterior tibial artery had a high-grade stenosis in the  80 to 90% range at the level of the ankle as a transition of the dorsalis pedis artery.  The dorsalis pedis artery was poor in the foot and the posterior tibial artery was the more dominant runoff into the foot itself. It was felt that it was in the patient's best interest to proceed with intervention after these images to avoid a second procedure and a larger amount of contrast and fluoroscopy based off of the findings from the initial angiogram. The patient was systemically  heparinized and a long 5Fr sheath was then placed over the Terumo Advantage wire. I then used a Seeker catheter and the glide wire to get in initially to the anterior tibial artery where selective imaging confirmed the distal lesion at the level of the ankle and proximal foot.  We then exchanged for a V 18 wire and crossed the lesion parking it in the foot.  A 2.5 mm diameter angioplasty balloon was inflated to 8 atm for 1 minute in the distal anterior tibial artery and proximal dorsalis pedis artery.  Completion imaging showed only about a 20% residual stenosis although the flow in the foot was still quite sluggish and for this reason, we turned our attention to the posterior tibial artery.  Using a seeker catheter and Glidewire were able to get in the posterior tibial artery then exchanged for a V 18 wire which was used to cross the lesion at the ankle and parked the wire in the distal foot.  A 2 mm diameter by 10 cm length angioplasty balloon was then inflated to 12 atm for 1 minute in the distal posterior tibial artery across the ankle and proximal foot.  Completion imaging showed marked improvement with about a 30% residual stenosis and much more brisk filling of the foot. I elected to terminate the procedure. The sheath was removed and StarClose closure device was deployed in the left femoral artery with excellent hemostatic result. The patient was taken to the recovery room in stable condition having tolerated the procedure well.  Findings:               Aortogram:  This demonstrated normal renal arteries and normal aorta and iliac segments without significant stenosis.             Right Lower Extremity: Normal common femoral artery, profunda femoris artery, superficial femoral artery, and popliteal artery.  There is a typical tibial trifurcation.  All 3 tibial vessels are patent proximally.  The peroneal artery was small and had its typical course which terminated at the ankle.  The posterior tibial  artery had a high-grade stenosis in the 80% range at the level of the ankle and the anterior tibial artery had a high-grade stenosis in the 80 to 90% range at the level of the ankle as a transition of the dorsalis pedis artery.  The dorsalis pedis artery was poor in the foot and the posterior tibial artery was the more dominant runoff into the foot itself.   Disposition: Patient was taken to the recovery room in stable condition having tolerated the procedure well.  Complications: None  Leotis Pain 02/21/2021 2:18 PM   This note was created with Dragon Medical transcription system. Any errors in dictation are purely unintentional.

## 2021-02-22 ENCOUNTER — Encounter: Payer: Self-pay | Admitting: Vascular Surgery

## 2021-02-22 DIAGNOSIS — I96 Gangrene, not elsewhere classified: Secondary | ICD-10-CM

## 2021-02-22 LAB — CBC WITH DIFFERENTIAL/PLATELET
Abs Immature Granulocytes: 0.03 10*3/uL (ref 0.00–0.07)
Basophils Absolute: 0.1 10*3/uL (ref 0.0–0.1)
Basophils Relative: 1 %
Eosinophils Absolute: 0.3 10*3/uL (ref 0.0–0.5)
Eosinophils Relative: 5 %
HCT: 30.4 % — ABNORMAL LOW (ref 36.0–46.0)
Hemoglobin: 10.7 g/dL — ABNORMAL LOW (ref 12.0–15.0)
Immature Granulocytes: 0 %
Lymphocytes Relative: 26 %
Lymphs Abs: 1.8 10*3/uL (ref 0.7–4.0)
MCH: 32.5 pg (ref 26.0–34.0)
MCHC: 35.2 g/dL (ref 30.0–36.0)
MCV: 92.4 fL (ref 80.0–100.0)
Monocytes Absolute: 0.6 10*3/uL (ref 0.1–1.0)
Monocytes Relative: 9 %
Neutro Abs: 4.2 10*3/uL (ref 1.7–7.7)
Neutrophils Relative %: 59 %
Platelets: 261 10*3/uL (ref 150–400)
RBC: 3.29 MIL/uL — ABNORMAL LOW (ref 3.87–5.11)
RDW: 11.6 % (ref 11.5–15.5)
WBC: 7 10*3/uL (ref 4.0–10.5)
nRBC: 0 % (ref 0.0–0.2)

## 2021-02-22 LAB — BASIC METABOLIC PANEL
Anion gap: 6 (ref 5–15)
BUN: 13 mg/dL (ref 8–23)
CO2: 21 mmol/L — ABNORMAL LOW (ref 22–32)
Calcium: 8.8 mg/dL — ABNORMAL LOW (ref 8.9–10.3)
Chloride: 109 mmol/L (ref 98–111)
Creatinine, Ser: 0.72 mg/dL (ref 0.44–1.00)
GFR, Estimated: >60 mL/min (ref >60)
Glucose, Bld: 142 mg/dL — ABNORMAL HIGH (ref 70–99)
Potassium: 3.6 mmol/L (ref 3.5–5.1)
Sodium: 136 mmol/L (ref 135–145)

## 2021-02-22 LAB — CULTURE, BLOOD (SINGLE): Culture: NO GROWTH

## 2021-02-22 LAB — GLUCOSE, CAPILLARY
Glucose-Capillary: 130 mg/dL — ABNORMAL HIGH (ref 70–99)
Glucose-Capillary: 157 mg/dL — ABNORMAL HIGH (ref 70–99)

## 2021-02-22 LAB — MAGNESIUM: Magnesium: 1.9 mg/dL (ref 1.7–2.4)

## 2021-02-22 LAB — PHOSPHORUS: Phosphorus: 2.8 mg/dL (ref 2.5–4.6)

## 2021-02-22 MED ORDER — ATORVASTATIN CALCIUM 10 MG PO TABS: 10.0000 mg | ORAL_TABLET | Freq: Every day | ORAL | 0 refills | Status: AC

## 2021-02-22 MED ORDER — CLOPIDOGREL BISULFATE 75 MG PO TABS: 75.0000 mg | ORAL_TABLET | Freq: Every day | ORAL | 0 refills | Status: AC

## 2021-02-22 MED ORDER — HYDRALAZINE HCL 25 MG PO TABS: 25.0000 mg | ORAL_TABLET | Freq: Three times a day (TID) | ORAL | 0 refills | Status: AC

## 2021-02-22 NOTE — Discharge Instructions (Signed)
Vascular Surgery Discharge Instructions: 1) you may shower as of tomorrow.  Please keep your groins clean and dry.  Gently clean your groin with soap and water.  Gently pat dry. 2) please do not engage in strenuous activity or lifting greater than heavier than 10 pounds until you are cleared at your first post procedure follow-up.

## 2021-02-22 NOTE — Progress Notes (Signed)
Patient discharged to home via wheelchair.  Patient states she will drive herself home, family to follow in car behind her.  Patient discharged with instructions, boot, prescriptions, and personal belongings. Patient able to teach back instructions.  IV site d/ced, area clean, dry and intact.  No acute distress noted. Care relinquished.  Verified with primary nurse no additional needs prior to discharge.

## 2021-02-22 NOTE — Progress Notes (Signed)
  Subjective:  Patient ID: Ashley Ellison, female    DOB: 10-02-1950,  MRN: [id number]  She states she is doing well.  The procedure went okay.  She does not have any pain in the toe.  She would like to discuss next treatment plan  Negative for chest pain and shortness of breath Review of all other systems is negative Objective:   Vitals:   02/22/21 0417 02/22/21 0751  BP: (!) 160/70 (!) 147/63  Pulse: 88 85  Resp: 16 18  Temp: 98.2 F (36.8 C) 98.6 F (37 C)  SpO2: 97% 98%   General AA&O x3. Normal mood and affect.  Vascular Palpable DP pulse, Non palp PT pulse  Neurologic Epicritic sensation grossly intact.  Dermatologic Gangrenous changes 5th toe stable  Orthopedic: MMT 5/5 in dorsiflexion, plantarflexion, inversion, and eversion. Normal joint ROM without pain or crepitus.   Op note from vascular surgery notes 80-90% stenosis of the PT and AT both underwent angioplasty with improvement in flow throughout the foot Assessment & Plan:  Patient was evaluated and treated and all questions answered.  R 5th toe gangrene -Doing well after procedure today -The gangrene clinically is stable at this time patient is okay to be discharged from podiatric standpoint.  We will manage it in an outpatient setting.  She will follow-up with [place] 1 week from discharge. -She can be weightbearing as tolerated in surgical shoe. -She can be discharged on 10 days of doxycycline. -I discussed with her to keep it dry and stable with Betadine paint.  She states understanding   Candelaria Stagers, DPM  Accessible via secure chat for questions or concerns.

## 2021-02-22 NOTE — Care Management Important Message (Signed)
Important Message  Patient Details  Name: Ashley Ellison MRN: [id number] Date of Birth: Dec 18, 1950   Medicare Important Message Given:  Yes     Olegario Messier A Lijah Bourque 02/22/2021, 11:02 AM

## 2021-02-22 NOTE — Discharge Summary (Signed)
Physician Discharge Summary  Patient ID: Ashley Ellison MRN: [id number] DOB/AGE: 08-04-1950 70 y.o.  Admit date: 02/17/2021 Discharge date: 02/22/2021  Admission Diagnoses:  Discharge Diagnoses:  Principal Problem:   Diabetic foot ulcers (HCC) Active Problems:   Hypertension associated with diabetes (HCC)   AKI (acute kidney injury) (HCC)   Hyperlipidemia associated with type 2 diabetes mellitus (HCC)   Diabetic foot ulcer (HCC)   Gangrene of toe of right foot (HCC)   Peripheral arterial disease (HCC)   Discharged Condition: good  Hospital Course:  Ashley Ellison is a 70 y.o. female with history of hypertension, hyperlipidemia, diabetes presented to hospital with right fifth toe swelling and redness.  Patient was initially placed on vancomycin and Rocephin.  MRI showed reactive osteitis without osteomyelitis. Patient had angiogram performed on 10/19, had a balloon dilation into right anterior tibial artery, proximal dorsalis pedis artery, and right distal posterior tibial artery.  Right fifth toe dry gangrene. Peripheral arterial disease at the right lower extremity. Patient is status post balloon dilation. Patient does not seem to have significant bacterial infection.  She was treated with vancomycin, Rocephin.  Upon reexamination today, there is no evidence of infection, she has dry gangrene of the fifth toe.  I would not continue any antibiotics.  Patient will follow-up with podiatry and vascular surgery in the near future.   Acute kidney injury. Hypokalemia. Renal function normalized.    Essential hypertension  Continue hydralazine.  Consults: vascular surgery and podiatry  Significant Diagnostic Studies:   Treatments: Lower extremity angiogram and balloon dilation.  Discharge Exam: Blood pressure (!) 147/63, pulse 85, temperature 98.6 F (37 C), resp. rate 18, height 5\' 7"  (1.702 m), weight 81.6 kg, SpO2 98 %. General appearance: alert and  cooperative Resp: clear to auscultation bilaterally Cardio: regular rate and rhythm, S1, S2 normal, no murmur, click, rub or gallop GI: soft, non-tender; bowel sounds normal; no masses,  no organomegaly Extremities: right fifth toe dry gangrene  Disposition: Discharge disposition: 01-Home or Self Care       Discharge Instructions     Diet - low sodium heart healthy   Complete by: As directed    Discharge wound care:   Complete by: As directed    Podiatry follow up.   Increase activity slowly   Complete by: As directed       Allergies as of 02/22/2021   No Known Allergies      Medication List     STOP taking these medications    lisinopril 10 MG tablet Commonly known as: ZESTRIL   metFORMIN 500 MG tablet Commonly known as: Glucophage   naproxen 500 MG tablet Commonly known as: Naprosyn       TAKE these medications    atorvastatin 10 MG tablet Commonly known as: LIPITOR Take 1 tablet (10 mg total) by mouth daily. Start taking on: February 23, 2021   clopidogrel 75 MG tablet Commonly known as: PLAVIX Take 1 tablet (75 mg total) by mouth daily. Start taking on: February 23, 2021   hydrALAZINE 25 MG tablet Commonly known as: APRESOLINE Take 1 tablet (25 mg total) by mouth every 8 (eight) hours.               Discharge Care Instructions  (From admission, onward)           Start     Ordered   02/22/21 0000  Discharge wound care:       Comments: Podiatry follow up.   02/22/21  9147            Follow-up Information     McDonald, Rachelle Hora, DPM Follow up in 1 week(s).   Specialty: Podiatry Contact information: [address] [place] [address] [phone]         [name], MD Follow up in 1 week(s).   Specialty: Internal Medicine Contact information: [address] [facility] [place] [address] [phone]         Annice Needy, MD Follow up in 2 week(s).   Specialties: Vascular  Surgery, Radiology, Interventional Cardiology Contact information: [address] [place] [address] [phone]                32 minutes Signed: Marrion Coy 02/22/2021, 9:49 AM

## 2021-02-22 NOTE — TOC Transition Note (Signed)
Transition of Care [facility]) - CM/SW Discharge Note   Patient Details  Name: Ashley Ellison MRN: [id number] Date of Birth: Feb 08, 1951  Transition of Care [facility]) CM/SW Contact:  Margarito Liner, LCSW Phone Number: 02/22/2021, 10:16 AM   Clinical Narrative:  Patient has orders to discharge home today. No TOC needs identified. CSW signing off.   Final next level of care: Home/Self Care Barriers to Discharge: Barriers Resolved   Patient Goals and CMS Choice Patient states their goals for this hospitalization and ongoing recovery are:: To return home soon      Discharge Placement                    Patient and family notified of of transfer: 02/22/21  Discharge Plan and Services In-house Referral: Clinical Social Work   Post Acute Care Choice: Home Health                               Social Determinants of Health (SDOH) Interventions     Readmission Risk Interventions No flowsheet data found.

## 2021-02-22 NOTE — Progress Notes (Signed)
[facility] Daily Progress Note  02/21/21:             1.  Ultrasound guidance for vascular access left femoral artery             2.  Catheter placement into right common femoral artery from left femoral approach             3.  Aortogram and selective right lower extremity angiogram             4.  Percutaneous transluminal angioplasty of the right anterior tibial artery and proximal dorsalis pedis artery with 2.5 mm diameter angioplasty balloon             5.  Percutaneous transluminal angioplasty of the right distal posterior tibial artery with a 2 mm diameter angioplasty balloon             6.  StarClose closure device left femoral artery  Subjective: Patient without complaint this AM.  No acute issues overnight.  Denies and the right lower extremity discomfort.  Objective: Vitals:   02/21/21 1627 02/21/21 1955 02/22/21 0417 02/22/21 0751  BP: (!) 158/69 (!) 156/62 (!) 160/70 (!) 147/63  Pulse: 87 90 88 85  Resp: 20 16 16 18   Temp: 98.3 F (36.8 C) 99.4 F (37.4 C) 98.2 F (36.8 C) 98.6 F (37 C)  TempSrc: Oral Oral Oral   SpO2: 99% 98% 97% 98%  Weight:      Height:        Intake/Output Summary (Last 24 hours) at 02/22/2021 1114 Last data filed at 02/22/2021 1030 Gross per 24 hour  Intake 1985.88 ml  Output --  Net 1985.88 ml   Physical Exam: A&Ox3, NAD CV: RRR Pulmonary: CTA Bilaterally Abdomen: Soft, Nontender, Nondistended Vascular: Left groin:  Access site: No PAD.  Dressing is clean dry and intact.  No swelling or drainage.  Right lower extremity: Thigh soft.  Calf soft.  Extremities warm distally to toes.  Hard to palpate pedal pulses however the foot is warm and there is a good capillary refill.   Laboratory: CBC    Component Value Date/Time   WBC 7.0 02/22/2021 0432   HGB 10.7 (L) 02/22/2021 0432   HGB 14.1 03/10/2013 2008   HCT 30.4 (L) 02/22/2021 0432   HCT 40.8 03/10/2013 2008   PLT 261 02/22/2021 0432   PLT 224 03/10/2013  2008   BMET    Component Value Date/Time   NA 136 02/22/2021 0432   NA 135 (L) 03/10/2013 2008   K 3.6 02/22/2021 0432   K 4.2 03/10/2013 2008   CL 109 02/22/2021 0432   CL 101 03/10/2013 2008   CO2 21 (L) 02/22/2021 0432   CO2 24 03/10/2013 2008   GLUCOSE 142 (H) 02/22/2021 0432   GLUCOSE 327 (H) 03/10/2013 2008   BUN 13 02/22/2021 0432   BUN 8 03/10/2013 2008   CREATININE 0.72 02/22/2021 0432   CREATININE 0.88 03/10/2013 2008   CALCIUM 8.8 (L) 02/22/2021 0432   CALCIUM 10.3 (H) 03/10/2013 2008   GFRNONAA >60 02/22/2021 0432   GFRNONAA >60 03/10/2013 2008   GFRAA >60 03/20/2019 1858   GFRAA >60 03/10/2013 2008   Assessment/Planning: The patient is a 70 year old female who presented with a nonhealing ulceration to the right foot status post right lower extremity angiogram with intervention - POD#1  1) left groin access site clean and dry.  No swelling, ecchymosis or drainage. 2) patient is on aspirin, statin  and Plavix for medical management 3) patient to follow-up with podiatry in the outpatient setting 4) patient to follow-up in our clinic in approximately 1 month for continued disease surveillance 5) okay to be discharged home from vascular standpoint when medically stable  Discussed with Dr. Wallis Mart Denay Pleitez PA-C 02/22/2021 11:14 AM

## 2021-02-23 LAB — CULTURE, BLOOD (ROUTINE X 2)
Culture: NO GROWTH
Culture: NO GROWTH
Special Requests: ADEQUATE

## 2021-02-27 ENCOUNTER — Encounter (INDEPENDENT_AMBULATORY_CARE_PROVIDER_SITE_OTHER): Payer: Medicare Other | Admitting: Vascular Surgery

## 2021-02-28 ENCOUNTER — Encounter: Payer: Self-pay | Admitting: Podiatry

## 2021-02-28 ENCOUNTER — Ambulatory Visit (INDEPENDENT_AMBULATORY_CARE_PROVIDER_SITE_OTHER): Payer: Medicare Other | Admitting: Podiatry

## 2021-02-28 ENCOUNTER — Other Ambulatory Visit: Payer: Self-pay

## 2021-02-28 DIAGNOSIS — I96 Gangrene, not elsewhere classified: Secondary | ICD-10-CM | POA: Diagnosis not present

## 2021-02-28 DIAGNOSIS — I739 Peripheral vascular disease, unspecified: Secondary | ICD-10-CM | POA: Diagnosis not present

## 2021-02-28 NOTE — Progress Notes (Signed)
  Subjective:  Patient ID: Ashley Ellison, female    DOB: [birth year],  MRN: [id number]  Chief Complaint  Patient presents with   Foot Ulcer      New pt- 1 week follow up [facility]-saw Dr Fredia Sorrow foot diabetic ulcer    70 y.o. female presents with the above complaint. History confirmed with patient.  She presents for posthospital follow-up for right fifth toe gangrene after undergoing AT and PT angioplasty with Dr. Wyn Quaker  Objective:  Physical Exam: Foot is warm and fairly well perfused I am unable to get a strong palpable pulse but is present.  Fifth toe gangrenous changes still demarcating with ecchymosis and pallor in the margins   Assessment:   1. Peripheral arterial disease (HCC)   2. Gangrene of toe of right foot (HCC)      Plan:  Patient was evaluated and treated and all questions answered.  Fifth toe continues to demarcate.  I do think the tip of it will not recover and she will likely at least require fifth toe amputation.  I would like to see her back in 1 week for reevaluation to see if further demarcation takes place.  She will be in a week to week basis today before we decide on timing and level of amputation  Return in about 1 week (around 03/07/2021) for wound care.

## 2021-03-06 HISTORY — PX: LEG AMPUTATION BELOW KNEE: SHX694

## 2021-03-07 ENCOUNTER — Other Ambulatory Visit: Payer: Self-pay

## 2021-03-07 ENCOUNTER — Ambulatory Visit (INDEPENDENT_AMBULATORY_CARE_PROVIDER_SITE_OTHER): Payer: Medicare Other | Admitting: Podiatry

## 2021-03-07 DIAGNOSIS — I96 Gangrene, not elsewhere classified: Secondary | ICD-10-CM

## 2021-03-07 DIAGNOSIS — I739 Peripheral vascular disease, unspecified: Secondary | ICD-10-CM

## 2021-03-07 DIAGNOSIS — E11628 Type 2 diabetes mellitus with other skin complications: Secondary | ICD-10-CM | POA: Diagnosis not present

## 2021-03-07 DIAGNOSIS — L089 Local infection of the skin and subcutaneous tissue, unspecified: Secondary | ICD-10-CM

## 2021-03-07 DIAGNOSIS — L03115 Cellulitis of right lower limb: Secondary | ICD-10-CM | POA: Diagnosis not present

## 2021-03-07 MED ORDER — CEPHALEXIN 500 MG PO CAPS: 500.0000 mg | ORAL_CAPSULE | Freq: Three times a day (TID) | ORAL | 0 refills | Status: DC

## 2021-03-07 NOTE — Progress Notes (Signed)
  Subjective:  Patient ID: Ashley Ellison, female    DOB: 08-31-1950,  MRN: [id number]  Chief Complaint  Patient presents with   Foot Ulcer    WOUND CARE    70 y.o. female presents with the above complaint. History confirmed with patient.    Objective:  Physical Exam: Foot is warm and fairly well perfused I am unable to get a strong palpable pulse but is present.  Fifth toe gangrenous changes still demarcating with ecchymosis and pallor in the margins, this is worsened since last visit now has cellulitis along the lateral fifth metatarsal    Assessment:   1. Peripheral arterial disease (HCC)   2. Gangrene of toe of right foot (HCC)   3. Cellulitis of right foot   4. Diabetic foot infection (HCC)      Plan:  Patient was evaluated and treated and all questions answered.  Unfortunately has worsened since last visit.  Think we should proceed with amputation with her cellulitis I recommend she be admitted for IV antibiotics and inpatient amputation.  May need to do is a staged procedure pending perfusion.  I discussed her case with the admitting hospitalist and she will be admitted to the hospital directly.  Advised her that bed availability is an issue and the ER has been very full.  If it takes more than 24 hours for admission then would recommend she go to the ER and we can admit her to the operating room from the ER.  I prescribed her Keflex to start taking now until she is admitted to the hospital.  She understands the risk of amputation and the need for surgery at this point.  All questions were addressed.  No follow-ups on file.

## 2021-03-08 ENCOUNTER — Other Ambulatory Visit: Payer: Self-pay

## 2021-03-08 ENCOUNTER — Encounter: Payer: Self-pay | Admitting: Emergency Medicine

## 2021-03-08 ENCOUNTER — Telehealth: Payer: Self-pay | Admitting: Podiatry

## 2021-03-08 ENCOUNTER — Emergency Department: Payer: Medicare Other

## 2021-03-08 ENCOUNTER — Inpatient Hospital Stay
Admission: EM | Admit: 2021-03-08 | Discharge: 2021-03-11 | DRG: 256 | Disposition: A | Discharge status: Home / Self Care | Payer: Medicare Other | Attending: Internal Medicine | Admitting: Internal Medicine

## 2021-03-08 DIAGNOSIS — E1152 Type 2 diabetes mellitus with diabetic peripheral angiopathy with gangrene: Secondary | ICD-10-CM | POA: Diagnosis not present

## 2021-03-08 DIAGNOSIS — L97509 Non-pressure chronic ulcer of other part of unspecified foot with unspecified severity: Secondary | ICD-10-CM | POA: Diagnosis present

## 2021-03-08 DIAGNOSIS — E1165 Type 2 diabetes mellitus with hyperglycemia: Secondary | ICD-10-CM | POA: Diagnosis present

## 2021-03-08 DIAGNOSIS — L03031 Cellulitis of right toe: Secondary | ICD-10-CM | POA: Diagnosis not present

## 2021-03-08 DIAGNOSIS — I739 Peripheral vascular disease, unspecified: Secondary | ICD-10-CM | POA: Diagnosis present

## 2021-03-08 DIAGNOSIS — E785 Hyperlipidemia, unspecified: Secondary | ICD-10-CM | POA: Diagnosis present

## 2021-03-08 DIAGNOSIS — L089 Local infection of the skin and subcutaneous tissue, unspecified: Secondary | ICD-10-CM | POA: Diagnosis not present

## 2021-03-08 DIAGNOSIS — E860 Dehydration: Secondary | ICD-10-CM | POA: Diagnosis present

## 2021-03-08 DIAGNOSIS — E11621 Type 2 diabetes mellitus with foot ulcer: Secondary | ICD-10-CM | POA: Diagnosis present

## 2021-03-08 DIAGNOSIS — Z7984 Long term (current) use of oral hypoglycemic drugs: Secondary | ICD-10-CM

## 2021-03-08 DIAGNOSIS — Z7902 Long term (current) use of antithrombotics/antiplatelets: Secondary | ICD-10-CM

## 2021-03-08 DIAGNOSIS — E1169 Type 2 diabetes mellitus with other specified complication: Secondary | ICD-10-CM | POA: Diagnosis not present

## 2021-03-08 DIAGNOSIS — N179 Acute kidney failure, unspecified: Secondary | ICD-10-CM | POA: Diagnosis not present

## 2021-03-08 DIAGNOSIS — Z20822 Contact with and (suspected) exposure to covid-19: Secondary | ICD-10-CM | POA: Diagnosis present

## 2021-03-08 DIAGNOSIS — I152 Hypertension secondary to endocrine disorders: Secondary | ICD-10-CM | POA: Diagnosis present

## 2021-03-08 DIAGNOSIS — L97519 Non-pressure chronic ulcer of other part of right foot with unspecified severity: Secondary | ICD-10-CM | POA: Diagnosis present

## 2021-03-08 DIAGNOSIS — Z79899 Other long term (current) drug therapy: Secondary | ICD-10-CM

## 2021-03-08 DIAGNOSIS — E1159 Type 2 diabetes mellitus with other circulatory complications: Secondary | ICD-10-CM

## 2021-03-08 LAB — COMPREHENSIVE METABOLIC PANEL
ALT: 13 U/L (ref 0–44)
AST: 17 U/L (ref 15–41)
Albumin: 3.6 g/dL (ref 3.5–5.0)
Alkaline Phosphatase: 44 U/L (ref 38–126)
Anion gap: 11 (ref 5–15)
BUN: 16 mg/dL (ref 8–23)
CO2: 26 mmol/L (ref 22–32)
Calcium: 9.8 mg/dL (ref 8.9–10.3)
Chloride: 102 mmol/L (ref 98–111)
Creatinine, Ser: 1.13 mg/dL — ABNORMAL HIGH (ref 0.44–1.00)
GFR, Estimated: 52 mL/min — ABNORMAL LOW (ref >60)
Glucose, Bld: 211 mg/dL — ABNORMAL HIGH (ref 70–99)
Potassium: 4 mmol/L (ref 3.5–5.1)
Sodium: 139 mmol/L (ref 135–145)
Total Bilirubin: 0.8 mg/dL (ref 0.3–1.2)
Total Protein: 8.7 g/dL — ABNORMAL HIGH (ref 6.5–8.1)

## 2021-03-08 LAB — CBG MONITORING, ED: Glucose-Capillary: 166 mg/dL — ABNORMAL HIGH (ref 70–99)

## 2021-03-08 LAB — GLUCOSE, CAPILLARY: Glucose-Capillary: 135 mg/dL — ABNORMAL HIGH (ref 70–99)

## 2021-03-08 LAB — URINALYSIS, ROUTINE W REFLEX MICROSCOPIC
Bilirubin Urine: NEGATIVE
Glucose, UA: 50 mg/dL — AB
Ketones, ur: NEGATIVE mg/dL
Nitrite: NEGATIVE
Protein, ur: 100 mg/dL — AB
Specific Gravity, Urine: 1.024 (ref 1.005–1.030)
pH: 5 (ref 5.0–8.0)

## 2021-03-08 LAB — CBC WITH DIFFERENTIAL/PLATELET
Abs Immature Granulocytes: 0.03 10*3/uL (ref 0.00–0.07)
Basophils Absolute: 0.1 10*3/uL (ref 0.0–0.1)
Basophils Relative: 1 %
Eosinophils Absolute: 0.3 10*3/uL (ref 0.0–0.5)
Eosinophils Relative: 4 %
HCT: 36.3 % (ref 36.0–46.0)
Hemoglobin: 12.3 g/dL (ref 12.0–15.0)
Immature Granulocytes: 0 %
Lymphocytes Relative: 25 %
Lymphs Abs: 2.1 10*3/uL (ref 0.7–4.0)
MCH: 31.3 pg (ref 26.0–34.0)
MCHC: 33.9 g/dL (ref 30.0–36.0)
MCV: 92.4 fL (ref 80.0–100.0)
Monocytes Absolute: 0.7 10*3/uL (ref 0.1–1.0)
Monocytes Relative: 8 %
Neutro Abs: 5.1 10*3/uL (ref 1.7–7.7)
Neutrophils Relative %: 62 %
Platelets: 377 10*3/uL (ref 150–400)
RBC: 3.93 MIL/uL (ref 3.87–5.11)
RDW: 11.8 % (ref 11.5–15.5)
WBC: 8.2 10*3/uL (ref 4.0–10.5)
nRBC: 0 % (ref 0.0–0.2)

## 2021-03-08 LAB — LACTIC ACID, PLASMA: Lactic Acid, Venous: 1.5 mmol/L (ref 0.5–1.9)

## 2021-03-08 LAB — APTT: aPTT: 26 seconds (ref 24–36)

## 2021-03-08 LAB — RESP PANEL BY RT-PCR (FLU A&B, COVID) ARPGX2
Influenza A by PCR: NEGATIVE
Influenza B by PCR: NEGATIVE
SARS Coronavirus 2 by RT PCR: NEGATIVE

## 2021-03-08 LAB — PROTIME-INR
INR: 1 (ref 0.8–1.2)
Prothrombin Time: 13.5 seconds (ref 11.4–15.2)

## 2021-03-08 LAB — SEDIMENTATION RATE: Sed Rate: 66 mm/hr — ABNORMAL HIGH (ref 0–30)

## 2021-03-08 LAB — C-REACTIVE PROTEIN: CRP: 1.1 mg/dL — ABNORMAL HIGH (ref <1.0)

## 2021-03-08 MED ORDER — SODIUM CHLORIDE 0.9 % IV SOLN
2.0000 g | Freq: Once | INTRAVENOUS | Status: AC
  Administered 2021-03-08: 2 g via INTRAVENOUS
  Filled 2021-03-08: qty 20

## 2021-03-08 MED ORDER — HYDRALAZINE HCL 20 MG/ML IJ SOLN
5.0000 mg | INTRAMUSCULAR | Status: DC | PRN
  Administered 2021-03-09 – 2021-03-11 (×2): 5 mg via INTRAVENOUS
  Filled 2021-03-08 (×3): qty 1

## 2021-03-08 MED ORDER — INSULIN ASPART 100 UNIT/ML IJ SOLN: 0.0000 [IU] | Freq: Every day | INTRAMUSCULAR | Status: DC

## 2021-03-08 MED ORDER — HEPARIN SODIUM (PORCINE) 5000 UNIT/ML IJ SOLN
5000.0000 [IU] | Freq: Three times a day (TID) | INTRAMUSCULAR | Status: DC
  Administered 2021-03-08 – 2021-03-11 (×7): 5000 [IU] via SUBCUTANEOUS
  Filled 2021-03-08 (×7): qty 1

## 2021-03-08 MED ORDER — INSULIN ASPART 100 UNIT/ML IJ SOLN
0.0000 [IU] | Freq: Three times a day (TID) | INTRAMUSCULAR | Status: DC
  Administered 2021-03-08: 2 [IU] via SUBCUTANEOUS
  Administered 2021-03-08: 3 [IU] via SUBCUTANEOUS
  Administered 2021-03-10: 1 [IU] via SUBCUTANEOUS
  Administered 2021-03-10: 2 [IU] via SUBCUTANEOUS
  Filled 2021-03-08 (×2): qty 1

## 2021-03-08 MED ORDER — SODIUM CHLORIDE 0.9 % IV SOLN
1.0000 g | INTRAVENOUS | Status: DC
  Administered 2021-03-09 – 2021-03-10 (×2): 1 g via INTRAVENOUS
  Filled 2021-03-08 (×2): qty 10

## 2021-03-08 MED ORDER — OXYCODONE-ACETAMINOPHEN 5-325 MG PO TABS
1.0000 | ORAL_TABLET | ORAL | Status: DC | PRN
  Administered 2021-03-09 – 2021-03-11 (×9): 1 via ORAL
  Filled 2021-03-08 (×10): qty 1

## 2021-03-08 MED ORDER — VANCOMYCIN HCL 1500 MG/300ML IV SOLN
1500.0000 mg | INTRAVENOUS | Status: DC
  Administered 2021-03-09 – 2021-03-10 (×2): 1500 mg via INTRAVENOUS
  Filled 2021-03-08 (×2): qty 300

## 2021-03-08 MED ORDER — ONDANSETRON HCL 4 MG/2ML IJ SOLN: 4.0000 mg | Freq: Three times a day (TID) | INTRAMUSCULAR | Status: DC | PRN

## 2021-03-08 MED ORDER — HYDRALAZINE HCL 25 MG PO TABS
25.0000 mg | ORAL_TABLET | Freq: Three times a day (TID) | ORAL | Status: DC
  Administered 2021-03-08 – 2021-03-11 (×9): 25 mg via ORAL
  Filled 2021-03-08 (×9): qty 1

## 2021-03-08 MED ORDER — ACETAMINOPHEN 325 MG PO TABS: 650.0000 mg | ORAL_TABLET | Freq: Four times a day (QID) | ORAL | Status: DC | PRN

## 2021-03-08 MED ORDER — VANCOMYCIN HCL 1750 MG/350ML IV SOLN
1750.0000 mg | Freq: Once | INTRAVENOUS | Status: AC
  Administered 2021-03-08: 1750 mg via INTRAVENOUS
  Filled 2021-03-08: qty 350

## 2021-03-08 MED ORDER — ATORVASTATIN CALCIUM 10 MG PO TABS
10.0000 mg | ORAL_TABLET | Freq: Every day | ORAL | Status: DC
  Administered 2021-03-08 – 2021-03-10 (×3): 10 mg via ORAL
  Filled 2021-03-08 (×3): qty 1

## 2021-03-08 MED ORDER — LACTATED RINGERS IV SOLN: INTRAVENOUS | Status: AC

## 2021-03-08 MED ORDER — VANCOMYCIN HCL IN DEXTROSE 1-5 GM/200ML-% IV SOLN
1000.0000 mg | Freq: Once | INTRAVENOUS | Status: DC
  Filled 2021-03-08: qty 200

## 2021-03-08 NOTE — Sepsis Progress Note (Signed)
ELink monitoring sepsis protocol 

## 2021-03-08 NOTE — ED Triage Notes (Signed)
Pt comes into the ED via POV c/o toe infection.  Pt sent over by her podiatrist.  Pt states that she is supposed to have an upcoming surgery to amputation of the pinky toe on the right foot, but when she was seen yesterday they noticed there was a current infection.  Pt sent for IV antibiotics.

## 2021-03-08 NOTE — Consult Note (Signed)
Pharmacy Antibiotic Note  Ashley Ellison is a 70 y.o. female admitted on 03/08/2021 with cellulitis.  Pharmacy has been consulted for Vancomycin dosing.  Plan: Vancomycin 1750mg  loading, follow by Vancomycin 1500 mg IV Q 24 hrs.  Goal AUC 400-550. Expected AUC: 480 SCr used: 1.13   Height: 5\' 7"  (170.2 cm) Weight: 81.6 kg (180 lb) IBW/kg (Calculated) : 61.6  Temp (24hrs), Avg:98.6 F (37 C), Min:98.6 F (37 C), Max:98.6 F (37 C)  Recent Labs  Lab 03/08/21 1028  WBC 8.2  CREATININE 1.13*  LATICACIDVEN 1.5    Estimated Creatinine Clearance: 50.2 mL/min (A) (by C-G formula based on SCr of 1.13 mg/dL (H)).    No Known Allergies  Antimicrobials this admission: 11/3 Ceftriaxone >>  11/3 Vancomycin >>   Dose adjustments this admission: N/a  Microbiology results: 11/3 BCx: pending  Thank you for allowing pharmacy to be a part of this patient's care.  Stephanye Finnicum Rodriguez-Guzman PharmD, BCPS 03/08/2021 12:49 PM

## 2021-03-08 NOTE — Telephone Encounter (Signed)
Patient called stating she saw Dr Lilian Kapur yesterday she was suppose to have a hospital room waiting. Per opt there is no record of her getting a room. Patients tele number is [phone].

## 2021-03-08 NOTE — Telephone Encounter (Signed)
Ok thank you 

## 2021-03-08 NOTE — H&P (Signed)
History and Physical    Ashley Ellison MRN:[id number] DOB: 03/25/1951 DOA: 03/08/2021  Referring MD/NP/PA:   PCP: Ellison, Ashley W, MD   Patient coming from:  The patient is coming from home.  At baseline, pt is independent for most of ADL.        Chief Complaint: right foot ulcer with infection  HPI: Ashley Ellison is a 70 y.o. female with medical history significant of hypertension, hyperlipidemia, diabetes mellitus, PVD, who presents with right foot ulcer with infection.  Patient states that he has a chronic right foot diabetic foot ulcer in fifth toe.  He has been follow-up with the podiatrist. Pt was seen by her podiatrist a week ago and was told that she has surrounding infection of a chronic right fifth digit wound. She was started on Keflex yesterday, no improvement. Pt was sent to ED by her podiatrist, Dr. McDonald for IV antibiotics.  Patient has mild to moderate right foot pain, no fever or chills.  Patient denies chest pain, cough, shortness of breath.  No nausea, vomiting, diarrhea or abdominal pain.  No symptoms of UTI.  ED Course: pt was found to have WBC 8.2, lactic acid 1.5, urinalysis with squamous cell contamination, AKI with creatinine 1.13, BUN 60 and GFR 52, temperature normal, blood pressure 148/66, heart rate 90, RR 17, oxygen saturation 100% on room air.  X-ray of right foot did not show evidence of osteomyelitis. Pt is admitted to MedSurg bed as inpatient.  Dr. McDonald of podiatry is consulted.  Review of Systems:   General: no fevers, chills, no body weight gain, fatigue HEENT: no blurry vision, hearing changes or sore throat Respiratory: no dyspnea, coughing, wheezing CV: no chest pain, no palpitations GI: no nausea, vomiting, abdominal pain, diarrhea, constipation GU: no dysuria, burning on urination, increased urinary frequency, hematuria  Ext: no leg edema Neuro: no unilateral weakness, numbness, or tingling, no vision change or hearing  loss Skin: has discolored necrotic ulcer in right fifth toe MSK: No muscle spasm, no deformity, no limitation of range of movement in spin Heme: No easy bruising.  Travel history: No recent long distant travel.  Allergy: No Known Allergies  Past Medical History:  Diagnosis Date   Diabetes mellitus without complication (HCC)    Hypertension     Past Surgical History:  Procedure Laterality Date   BACK SURGERY     LOWER EXTREMITY ANGIOGRAPHY Right 02/21/2021   Procedure: Lower Extremity Angiography;  Surgeon: Dew, Jason S, MD;  Location: [facility] INVASIVE CV LAB;  Service: Cardiovascular;  Laterality: Right;   VAGINAL DELIVERY      Social History:  reports that she has never smoked. She has never used smokeless tobacco. She reports that she does not drink alcohol. No history on file for drug use.  Family History:  Family History  Problem Relation Age of Onset   Liver disease Mother      Prior to Admission medications   Medication Sig Start Date End Date Taking? Authorizing Provider  atorvastatin (LIPITOR) 10 MG tablet Take 1 tablet (10 mg total) by mouth daily. 02/23/21   Zhang, Dekui, MD  cephALEXin (KEFLEX) 500 MG capsule Take 1 capsule (500 mg total) by mouth 3 (three) times daily. 03/07/21   McDonald, Adam R, DPM  clopidogrel (PLAVIX) 75 MG tablet Take 1 tablet (75 mg total) by mouth daily. 02/23/21   Zhang, Dekui, MD  empagliflozin (JARDIANCE) 25 MG TABS tablet Take by mouth. 02/25/18   [provider]  hydrALAZINE (  APRESOLINE) 25 MG tablet Take 1 tablet (25 mg total) by mouth every 8 (eight) hours. 02/22/21   Sharen Hones, MD  metFORMIN (GLUCOPHAGE-XR) 500 MG 24 hr tablet 500 mg (1 pill) twice daily with titration to 1000 mg (2 pills)  twice daily over the next week 04/13/18   [provider]  naproxen (NAPROSYN) 500 MG tablet Take 1 tablet by mouth 2 (two) times daily. 10/15/19   [provider]    Physical Exam: Vitals:   03/08/21 0924 03/08/21 0928   BP:  (!) 148/66  Pulse:  90  Resp:  17  Temp:  98.6 F (37 C)  TempSrc:  Oral  SpO2:  100%  Weight: 81.6 kg   Height: 5' 7" (1.702 m)    General: Not in acute distress HEENT:       Eyes: PERRL, EOMI, no scleral icterus.       ENT: No discharge from the ears and nose, no pharynx injection, no tonsillar enlargement.        Neck: No JVD, no bruit, no mass felt. Heme: No neck lymph node enlargement. Cardiac: S1/S2, RRR, No murmurs, No gallops or rubs. Respiratory: No rales, wheezing, rhonchi or rubs. GI: Soft, nondistended, nontender, no rebound pain, no organomegaly, BS present. GU: No hematuria Ext: No pitting leg edema bilaterally. 1+DP/PT pulse bilaterally. Musculoskeletal: No joint deformities, No joint redness or warmth, no limitation of ROM in spin. Skin:  has discolored necrotic ulcer in right fifth toe     Neuro: Alert, oriented X3, cranial nerves II-XII grossly intact, moves all extremities normally.  Psych: Patient is not psychotic, no suicidal or hemocidal ideation.  Labs on Admission: I have personally reviewed following labs and imaging studies  CBC: Recent Labs  Lab 03/08/21 1028  WBC 8.2  NEUTROABS 5.1  HGB 12.3  HCT 36.3  MCV 92.4  PLT 532   Basic Metabolic Panel: Recent Labs  Lab 03/08/21 1028  NA 139  K 4.0  CL 102  CO2 26  GLUCOSE 211*  BUN 16  CREATININE 1.13*  CALCIUM 9.8   GFR: Estimated Creatinine Clearance: 50.2 mL/min (A) (by C-G formula based on SCr of 1.13 mg/dL (H)). Liver Function Tests: Recent Labs  Lab 03/08/21 1028  AST 17  ALT 13  ALKPHOS 44  BILITOT 0.8  PROT 8.7*  ALBUMIN 3.6   No results for input(s): LIPASE, AMYLASE in the last 168 hours. No results for input(s): AMMONIA in the last 168 hours. Coagulation Profile: No results for input(s): INR, PROTIME in the last 168 hours. Cardiac Enzymes: No results for input(s): CKTOTAL, CKMB, CKMBINDEX, TROPONINI in the last 168 hours. BNP (last 3 results) No results  for input(s): PROBNP in the last 8760 hours. HbA1C: No results for input(s): HGBA1C in the last 72 hours. CBG: No results for input(s): GLUCAP in the last 168 hours. Lipid Profile: No results for input(s): CHOL, HDL, LDLCALC, TRIG, CHOLHDL, LDLDIRECT in the last 72 hours. Thyroid Function Tests: No results for input(s): TSH, T4TOTAL, FREET4, T3FREE, THYROIDAB in the last 72 hours. Anemia Panel: No results for input(s): VITAMINB12, FOLATE, FERRITIN, TIBC, IRON, RETICCTPCT in the last 72 hours. Urine analysis:    Component Value Date/Time   COLORURINE AMBER (A) 03/08/2021 1028   APPEARANCEUR CLOUDY (A) 03/08/2021 1028   LABSPEC 1.024 03/08/2021 1028   PHURINE 5.0 03/08/2021 1028   GLUCOSEU 50 (A) 03/08/2021 1028   HGBUR SMALL (A) 03/08/2021 1028   BILIRUBINUR NEGATIVE 03/08/2021 1028   KETONESUR NEGATIVE  03/08/2021 1028   PROTEINUR 100 (A) 03/08/2021 1028   NITRITE NEGATIVE 03/08/2021 1028   LEUKOCYTESUR SMALL (A) 03/08/2021 1028   Sepsis Labs: _0 (procalcitonin:4,lacticidven:4) )No results found for this or any previous visit (from the past 240 hour(s)).   Radiological Exams on Admission: DG Foot Complete Right  Result Date: 03/08/2021 CLINICAL DATA:  Fifth toe infection EXAM: RIGHT FOOT COMPLETE - 3+ VIEW COMPARISON:  None. FINDINGS: Alignment is anatomic. No acute fracture. Joint spaces are preserved. No erosive changes or periosteal reaction identified. Extensive vascular calcification. IMPRESSION: No evidence of osteomyelitis by radiograph. Electronically Signed   By: Macy Mis M.D.   On: 03/08/2021 11:24     EKG: I have personally reviewed.  Sinus rhythm, QTC 445, LAE, nonspecific T wave change   Assessment/Plan Principal Problem:   Right foot infection Active Problems:   Hypertension associated with diabetes ([place])   AKI (acute kidney injury) ([place])   Hyperlipidemia associated with type 2 diabetes mellitus ([place])   Diabetic foot ulcer ([place])   Peripheral  arterial disease (HCC)   Type 2 diabetes mellitus with other specified complication (HCC)   Right foot infection and diabetic foot ulcer in right 5th toe: pt failed outpatient Keflex treatment.  Likely need surgery.  Dr. Sherryle Lis of podiatry is consulted, planning to do surgery tomorrow.  - Admitted to MedSurg bed as inpatient - Empiric antimicrobial treatment with vancomycin, Rocephin - PRN Zofran for nausea, tylenol and Percocet for pain - Blood cultures x 2  - ESR and CRP - wound care consult - INR/PTT - NPO after MN  Hypertension associated with diabetes ([place]) -IV hydralazine as needed -Oral hydralazine 25 mg 3 times daily  AKI (acute kidney injury) ([place]): Mild, likely due to dehydration -75 cc/h of LR  Hyperlipidemia associated with type 2 diabetes mellitus (HCC) -Lipitor  Peripheral arterial disease (HCC) -Continue Lipitor -Hold Plavix for surgery  Type 2 diabetes mellitus with other specified complication [facility]): Recent A1c 10.3, poorly controlled.  Patient taking glipizide -Sliding scale insulin         DVT ppx: SQ Heparin   Code Status: Full code Family Communication: not done, no family member is at bed side.    Disposition Plan:  Anticipate discharge back to previous environment Consults called:  Dr. Sherryle Lis of podiatry Admission status and Level of care: Med-Surg: for obs   Status is: Observation  The patient remains OBS appropriate and will d/c before 2 midnights.        Date of Service 03/08/2021    Ivor Costa [facility]   If 7PM-7AM, please contact night-coverage [email or web address] 03/08/2021, 1:20 PM

## 2021-03-08 NOTE — ED Notes (Signed)
Patient set up eating food tray

## 2021-03-08 NOTE — ED Provider Notes (Signed)
[facility] Emergency Department Provider Note   ____________________________________________   Event Date/Time   First MD Initiated Contact with Patient 03/08/21 [phone]     (approximate)  I have reviewed the triage vital signs and the nursing notes.   HISTORY  Chief Complaint Wound Infection    HPI Ashley Ellison is a 70 y.o. female presents from her podiatrist office (Dr. Sherryle Lis) with concern for a right fifth digit acute infection of a chronic wound  LOCATION: Right fifth toe and surrounding right foot DURATION: Wound present for months and surrounding redness worsening over the last week TIMING: Worsening over the last week SEVERITY: Moderate QUALITY: Aching pain and erythema CONTEXT: Patient followed up with her podiatrist a couple days ago and was told that she has surrounding infection of a chronic right fifth digit wound and placed on cephalexin of which she is only taken 2 doses.  Patient had a follow-up with her podiatrist today who sent her to the emergency department for IV antibiotics MODIFYING FACTORS: Any palpation around the fifth digit or the forefoot causes pain and she denies any relieving factors ASSOCIATED SYMPTOMS: Denies   Per medical record review, patient has history of peripheral artery disease and type 2 diabetes as well as gangrene of the right fifth toe          Past Medical History:  Diagnosis Date   Diabetes mellitus without complication ([place])    Hypertension     Patient Active Problem List   Diagnosis Date Noted   Right foot infection 03/08/2021   Type 2 diabetes mellitus with other specified complication ([place]) [id number]   Peripheral arterial disease ([place]) 02/21/2021   Gangrene of toe of right foot ([place])    Diabetic foot ulcers ([place]) 02/17/2021   AKI (acute kidney injury) ([place]) 02/17/2021   Diabetic foot ulcer ([place]) 02/17/2021   E coli bacteremia 03/19/2019   Diabetes mellitus without complication  ([place])    Hypertension associated with diabetes ([place])    Diabetic polyneuropathy associated with type 2 diabetes mellitus ([place]) 03/01/2018   Hyperlipidemia associated with type 2 diabetes mellitus ([place]) 11/20/2013   Osteoporosis 11/20/2013    Past Surgical History:  Procedure Laterality Date   BACK SURGERY     LOWER EXTREMITY ANGIOGRAPHY Right 02/21/2021   Procedure: Lower Extremity Angiography;  Surgeon: Algernon Huxley, MD;  Location: [facility];  Service: Cardiovascular;  Laterality: Right;   VAGINAL DELIVERY      Prior to Admission medications   Medication Sig Start Date End Date Taking? Authorizing Provider  atorvastatin (LIPITOR) 10 MG tablet Take 1 tablet (10 mg total) by mouth daily. 02/23/21  Yes Sharen Hones, MD  cephALEXin (KEFLEX) 500 MG capsule Take 1 capsule (500 mg total) by mouth 3 (three) times daily. 03/07/21  Yes McDonald, Stephan Minister, DPM  clopidogrel (PLAVIX) 75 MG tablet Take 1 tablet (75 mg total) by mouth daily. 02/23/21  Yes Sharen Hones, MD  glipiZIDE (GLUCOTROL XL) 5 MG 24 hr tablet Take 5 mg by mouth daily. 03/01/21  Yes [provider]  hydrALAZINE (APRESOLINE) 25 MG tablet Take 1 tablet (25 mg total) by mouth every 8 (eight) hours. 02/22/21  Yes Sharen Hones, MD    Allergies Patient has no known allergies.  Family History  Problem Relation Age of Onset   Liver disease Mother     Social History Social History   Tobacco Use   Smoking status: Never   Smokeless tobacco: Never  Substance Use  Topics   Alcohol use: No    Review of Systems Constitutional: No fever/chills Eyes: No visual changes. ENT: No sore throat. Cardiovascular: Denies chest pain. Respiratory: Denies shortness of breath. Gastrointestinal: No abdominal pain.  No nausea, no vomiting.  No diarrhea. Genitourinary: Negative for dysuria. Musculoskeletal: Positive for acute on chronic right fifth toe pain Skin: Negative for rash. Neurological: Negative for headaches,  weakness/numbness/paresthesias in any extremity Psychiatric: Negative for suicidal ideation/homicidal ideation   ____________________________________________   PHYSICAL EXAM:  VITAL SIGNS: ED Triage Vitals  Enc Vitals Group     BP 03/08/21 0928 (!) 148/66     Pulse Rate 03/08/21 0928 90     Resp 03/08/21 0928 17     Temp 03/08/21 0928 98.6 F (37 C)     Temp Source 03/08/21 0928 Oral     SpO2 03/08/21 0928 100 %     Weight 03/08/21 0924 180 lb (81.6 kg)     Height 03/08/21 0924 5\' 7"  (1.702 m)     Head Circumference --      Peak Flow --      Pain Score 03/08/21 0924 10     Pain Loc --      Pain Edu? --      Excl. in GC? --    Constitutional: Alert and oriented. Well appearing and in no acute distress. Eyes: Conjunctivae are normal. PERRL. Head: Atraumatic. Nose: No congestion/rhinnorhea. Mouth/Throat: Mucous membranes are moist. Neck: No stridor Cardiovascular: Grossly normal heart sounds.  Good peripheral circulation. Respiratory: Normal respiratory effort.  No retractions. Gastrointestinal: Soft and nontender. No distention. Musculoskeletal: No obvious deformities Neurologic:  Normal speech and language. No gross focal neurologic deficits are appreciated. Skin:  Skin is warm and dry.  Dry gangrene to the left fifth toe with surrounding erythema and induration Psychiatric: Mood and affect are normal. Speech and behavior are normal.  ____________________________________________   LABS (all labs ordered are listed, but only abnormal results are displayed)  Labs Reviewed  COMPREHENSIVE METABOLIC PANEL - Abnormal; Notable for the following components:      Result Value   Glucose, Bld 211 (*)    Creatinine, Ser 1.13 (*)    Total Protein 8.7 (*)    GFR, Estimated 52 (*)    All other components within normal limits  URINALYSIS, ROUTINE W REFLEX MICROSCOPIC - Abnormal; Notable for the following components:   Color, Urine AMBER (*)    APPearance CLOUDY (*)     Glucose, UA 50 (*)    Hgb urine dipstick SMALL (*)    Protein, ur 100 (*)    Leukocytes,Ua SMALL (*)    Bacteria, UA RARE (*)    All other components within normal limits  CULTURE, BLOOD (ROUTINE X 2)  CULTURE, BLOOD (ROUTINE X 2)  RESP PANEL BY RT-PCR (FLU A&B, COVID) ARPGX2  LACTIC ACID, PLASMA  CBC WITH DIFFERENTIAL/PLATELET  PROTIME-INR  APTT  SEDIMENTATION RATE  C-REACTIVE PROTEIN   ____________________________________________  EKG  ED ECG REPORT I, 13/03/22, the attending physician, personally viewed and interpreted this ECG.  Date: 03/08/2021 EKG Time: 1231 Rate: 93 Rhythm: normal sinus rhythm QRS Axis: normal Intervals: normal ST/T Wave abnormalities: normal Narrative Interpretation: no evidence of acute ischemia  ____________________________________________  RADIOLOGY  ED MD interpretation: X-ray of the right foot shows no evidence of osteomyelitis  Official radiology report(s): DG Foot Complete Right  Result Date: 03/08/2021 CLINICAL DATA:  Fifth toe infection EXAM: RIGHT FOOT COMPLETE - 3+ VIEW COMPARISON:  None.  FINDINGS: Alignment is anatomic. No acute fracture. Joint spaces are preserved. No erosive changes or periosteal reaction identified. Extensive vascular calcification. IMPRESSION: No evidence of osteomyelitis by radiograph. Electronically Signed   By: Macy Mis M.D.   On: 03/08/2021 11:24    ____________________________________________   PROCEDURES  Procedure(s) performed (including Critical Care):  Procedures   ____________________________________________   INITIAL IMPRESSION / ASSESSMENT AND PLAN / ED COURSE  As part of my medical decision making, I reviewed the following data within the electronic medical record, if available:  Nursing notes reviewed and incorporated, Labs reviewed, EKG interpreted, Old chart reviewed, Radiograph reviewed and Notes from prior ED visits reviewed and incorporated      Presentation most  consistent with complex cellulitis. Given History, Exam, and Workup I have low suspicion for Necrotizing Fasciitis, Abscess, Osteomyelitis, DVT or other emergent problem as a cause for this presentation. Given failure of outpatient antibiotic treatment, patient will require IV antibiotics and admission to the internal medicine service for further evaluation and management with podiatry following  Dispo: Admit to medicine      ____________________________________________   FINAL CLINICAL IMPRESSION(S) / ED DIAGNOSES  Final diagnoses:  Cellulitis of toe of right foot     ED Discharge Orders     None        Note:  This document was prepared using Dragon voice recognition software and may include unintentional dictation errors.    Naaman Plummer, MD 03/08/21 1538

## 2021-03-08 NOTE — Telephone Encounter (Signed)
Patient has arrived at [facility] ER @9 :30, spoke with Crystal admitting, patient declined yesterday and that since patient came in thru ER, physician there will put in a consult to admit from there. Per Dr , she is not to leave the hospital,surgery tomorrow.Called patient and informed of this.

## 2021-03-08 NOTE — Telephone Encounter (Signed)
I spoke with Vance Gather about this this morning and she is taking care of it. Thanks

## 2021-03-08 NOTE — Consult Note (Signed)
CODE SEPSIS - PHARMACY COMMUNICATION  **Broad Spectrum Antibiotics should be administered within 1 hour of Sepsis diagnosis**  Time Code Sepsis Called/Page Received: 1107  Antibiotics Ordered:  Ceftriaxone & Vancomycin  Time of 1st antibiotic administration: 1145  Additional action taken by pharmacy: none  If necessary, Name of Provider/Nurse Contacted: n/a   Day Deery Rodriguez-Guzman PharmD, BCPS 03/08/2021 11:38 AM

## 2021-03-08 NOTE — Consult Note (Signed)
PHARMACY -  BRIEF ANTIBIOTIC NOTE   Pharmacy has received consult(s) for Vancomycin from an ED provider.  The patient's profile has been reviewed for ht/wt/allergies/indication/available labs.    One time order(s) placed for Vancomycin  Further antibiotics/pharmacy consults should be ordered by admitting physician if indicated.                       Thank you, Lessly Stigler Rodriguez-Guzman PharmD, BCPS 03/08/2021 11:37 AM

## 2021-03-09 ENCOUNTER — Encounter: Payer: Self-pay | Admitting: Internal Medicine

## 2021-03-09 ENCOUNTER — Encounter
Admission: EM | Disposition: A | Discharge status: Home / Self Care | Payer: Self-pay | Source: Home / Self Care | Attending: Internal Medicine

## 2021-03-09 ENCOUNTER — Inpatient Hospital Stay: Payer: Medicare Other | Admitting: Anesthesiology

## 2021-03-09 ENCOUNTER — Encounter (INDEPENDENT_AMBULATORY_CARE_PROVIDER_SITE_OTHER): Payer: Medicare Other | Admitting: Vascular Surgery

## 2021-03-09 DIAGNOSIS — L089 Local infection of the skin and subcutaneous tissue, unspecified: Secondary | ICD-10-CM | POA: Diagnosis not present

## 2021-03-09 DIAGNOSIS — E860 Dehydration: Secondary | ICD-10-CM | POA: Diagnosis present

## 2021-03-09 DIAGNOSIS — Z7902 Long term (current) use of antithrombotics/antiplatelets: Secondary | ICD-10-CM | POA: Diagnosis not present

## 2021-03-09 DIAGNOSIS — E11621 Type 2 diabetes mellitus with foot ulcer: Secondary | ICD-10-CM | POA: Diagnosis present

## 2021-03-09 DIAGNOSIS — E785 Hyperlipidemia, unspecified: Secondary | ICD-10-CM | POA: Diagnosis present

## 2021-03-09 DIAGNOSIS — E1152 Type 2 diabetes mellitus with diabetic peripheral angiopathy with gangrene: Secondary | ICD-10-CM | POA: Diagnosis present

## 2021-03-09 DIAGNOSIS — N179 Acute kidney failure, unspecified: Secondary | ICD-10-CM | POA: Diagnosis present

## 2021-03-09 DIAGNOSIS — Z79899 Other long term (current) drug therapy: Secondary | ICD-10-CM | POA: Diagnosis not present

## 2021-03-09 DIAGNOSIS — I96 Gangrene, not elsewhere classified: Secondary | ICD-10-CM | POA: Diagnosis not present

## 2021-03-09 DIAGNOSIS — L97519 Non-pressure chronic ulcer of other part of right foot with unspecified severity: Secondary | ICD-10-CM | POA: Diagnosis present

## 2021-03-09 DIAGNOSIS — I152 Hypertension secondary to endocrine disorders: Secondary | ICD-10-CM | POA: Diagnosis present

## 2021-03-09 DIAGNOSIS — Z7984 Long term (current) use of oral hypoglycemic drugs: Secondary | ICD-10-CM | POA: Diagnosis not present

## 2021-03-09 DIAGNOSIS — E1169 Type 2 diabetes mellitus with other specified complication: Secondary | ICD-10-CM | POA: Diagnosis present

## 2021-03-09 DIAGNOSIS — E1165 Type 2 diabetes mellitus with hyperglycemia: Secondary | ICD-10-CM | POA: Diagnosis present

## 2021-03-09 DIAGNOSIS — L03031 Cellulitis of right toe: Secondary | ICD-10-CM | POA: Diagnosis present

## 2021-03-09 DIAGNOSIS — Z20822 Contact with and (suspected) exposure to covid-19: Secondary | ICD-10-CM | POA: Diagnosis present

## 2021-03-09 HISTORY — PX: AMPUTATION TOE: SHX6595

## 2021-03-09 LAB — BASIC METABOLIC PANEL
Anion gap: 9 (ref 5–15)
BUN: 13 mg/dL (ref 8–23)
CO2: 25 mmol/L (ref 22–32)
Calcium: 9.2 mg/dL (ref 8.9–10.3)
Chloride: 106 mmol/L (ref 98–111)
Creatinine, Ser: 0.71 mg/dL (ref 0.44–1.00)
GFR, Estimated: >60 mL/min (ref >60)
Glucose, Bld: 152 mg/dL — ABNORMAL HIGH (ref 70–99)
Potassium: 3.3 mmol/L — ABNORMAL LOW (ref 3.5–5.1)
Sodium: 140 mmol/L (ref 135–145)

## 2021-03-09 LAB — GLUCOSE, CAPILLARY
Glucose-Capillary: 150 mg/dL — ABNORMAL HIGH (ref 70–99)
Glucose-Capillary: 126 mg/dL — ABNORMAL HIGH (ref 70–99)
Glucose-Capillary: 124 mg/dL — ABNORMAL HIGH (ref 70–99)
Glucose-Capillary: 117 mg/dL — ABNORMAL HIGH (ref 70–99)

## 2021-03-09 LAB — CBC
HCT: 31.7 % — ABNORMAL LOW (ref 36.0–46.0)
Hemoglobin: 10.8 g/dL — ABNORMAL LOW (ref 12.0–15.0)
MCH: 31.4 pg (ref 26.0–34.0)
MCHC: 34.1 g/dL (ref 30.0–36.0)
MCV: 92.2 fL (ref 80.0–100.0)
Platelets: 310 10*3/uL (ref 150–400)
RBC: 3.44 MIL/uL — ABNORMAL LOW (ref 3.87–5.11)
RDW: 11.7 % (ref 11.5–15.5)
WBC: 6.9 10*3/uL (ref 4.0–10.5)
nRBC: 0 % (ref 0.0–0.2)

## 2021-03-09 SURGERY — AMPUTATION, TOE
Anesthesia: General | Site: Toe | Laterality: Right

## 2021-03-09 MED ORDER — POTASSIUM CHLORIDE CRYS ER 20 MEQ PO TBCR
40.0000 meq | EXTENDED_RELEASE_TABLET | Freq: Once | ORAL | Status: AC
  Administered 2021-03-09: 40 meq via ORAL
  Filled 2021-03-09: qty 2

## 2021-03-09 MED ORDER — PROPOFOL 500 MG/50ML IV EMUL
INTRAVENOUS | Status: DC | PRN
  Administered 2021-03-09: 50 ug/kg/min via INTRAVENOUS

## 2021-03-09 MED ORDER — LACTATED RINGERS IV SOLN: INTRAVENOUS | Status: DC | PRN

## 2021-03-09 MED ORDER — FENTANYL CITRATE (PF) 100 MCG/2ML IJ SOLN: 25.0000 ug | INTRAMUSCULAR | Status: DC | PRN

## 2021-03-09 MED ORDER — GLYCOPYRROLATE 0.2 MG/ML IJ SOLN: INTRAMUSCULAR | Status: DC | PRN

## 2021-03-09 MED ORDER — PHENYLEPHRINE HCL (PRESSORS) 10 MG/ML IV SOLN
INTRAVENOUS | Status: DC | PRN
  Administered 2021-03-09 (×2): 80 ug via INTRAVENOUS
  Administered 2021-03-09: 160 ug via INTRAVENOUS

## 2021-03-09 MED ORDER — 0.9 % SODIUM CHLORIDE (POUR BTL) OPTIME
TOPICAL | Status: DC | PRN
  Administered 2021-03-09: 200 mL

## 2021-03-09 MED ORDER — FENTANYL CITRATE (PF) 100 MCG/2ML IJ SOLN
INTRAMUSCULAR | Status: AC
  Filled 2021-03-09: qty 2

## 2021-03-09 MED ORDER — FENTANYL CITRATE (PF) 100 MCG/2ML IJ SOLN
INTRAMUSCULAR | Status: DC | PRN
  Administered 2021-03-09: 25 ug via INTRAVENOUS

## 2021-03-09 MED ORDER — LIDOCAINE HCL 1 % IJ SOLN
INTRAMUSCULAR | Status: DC | PRN
  Administered 2021-03-09: 10 mL

## 2021-03-09 MED ORDER — PROPOFOL 10 MG/ML IV BOLUS
INTRAVENOUS | Status: DC | PRN
  Administered 2021-03-09: 40 mg via INTRAVENOUS

## 2021-03-09 MED ORDER — EPHEDRINE SULFATE 50 MG/ML IJ SOLN
INTRAMUSCULAR | Status: DC | PRN
  Administered 2021-03-09: 5 mg via INTRAVENOUS

## 2021-03-09 MED ORDER — PROPOFOL 10 MG/ML IV BOLUS
INTRAVENOUS | Status: AC
  Filled 2021-03-09: qty 20

## 2021-03-09 MED ORDER — MORPHINE SULFATE (PF) 2 MG/ML IV SOLN
1.0000 mg | Freq: Once | INTRAVENOUS | Status: AC
  Administered 2021-03-10: 1 mg via INTRAVENOUS
  Filled 2021-03-09: qty 1

## 2021-03-09 MED ORDER — PHENYLEPHRINE HCL-NACL 20-0.9 MG/250ML-% IV SOLN
INTRAVENOUS | Status: AC
  Filled 2021-03-09: qty 250

## 2021-03-09 SURGICAL SUPPLY — 46 items
BLADE MED AGGRESSIVE (BLADE) IMPLANT
BLADE OSC/SAGITTAL MD 5.5X18 (BLADE) IMPLANT
BLADE SURG 15 STRL LF DISP TIS (BLADE) IMPLANT
BLADE SURG 15 STRL SS (BLADE)
BLADE SURG MINI STRL (BLADE) IMPLANT
BNDG CONFORM 2 STRL LF (GAUZE/BANDAGES/DRESSINGS) IMPLANT
BNDG ELASTIC 4X5.8 VLCR STR LF (GAUZE/BANDAGES/DRESSINGS) ×2 IMPLANT
BNDG ESMARK 4X12 TAN STRL LF (GAUZE/BANDAGES/DRESSINGS) ×2 IMPLANT
BNDG GAUZE ELAST 4 BULKY (GAUZE/BANDAGES/DRESSINGS) ×4 IMPLANT
CNTNR SPEC 2.5X3XGRAD LEK (MISCELLANEOUS) ×1
CONT SPEC 4OZ STER OR WHT (MISCELLANEOUS) ×1
CONTAINER SPEC 2.5X3XGRAD LEK (MISCELLANEOUS) ×1 IMPLANT
CUFF TOURN SGL QUICK 12 (TOURNIQUET CUFF) IMPLANT
CUFF TOURN SGL QUICK 18X4 (TOURNIQUET CUFF) ×2 IMPLANT
DRAPE FLUOR MINI C-ARM 54X84 (DRAPES) IMPLANT
DURAPREP 26ML APPLICATOR (WOUND CARE) ×2 IMPLANT
ELECT REM PT RETURN 9FT ADLT (ELECTROSURGICAL)
ELECTRODE REM PT RTRN 9FT ADLT (ELECTROSURGICAL) IMPLANT
GAUZE 4X4 16PLY ~~LOC~~+RFID DBL (SPONGE) ×2 IMPLANT
GAUZE SPONGE 4X4 12PLY STRL (GAUZE/BANDAGES/DRESSINGS) ×4 IMPLANT
GAUZE XEROFORM 1X8 LF (GAUZE/BANDAGES/DRESSINGS) ×2 IMPLANT
GLOVE SURG ENC MOIS LTX SZ7 (GLOVE) ×2 IMPLANT
GLOVE SURG UNDER LTX SZ7 (GLOVE) ×2 IMPLANT
GOWN STRL REUS W/ TWL LRG LVL3 (GOWN DISPOSABLE) ×2 IMPLANT
GOWN STRL REUS W/TWL LRG LVL3 (GOWN DISPOSABLE) ×2
HANDPIECE VERSAJET DEBRIDEMENT (MISCELLANEOUS) IMPLANT
IV NS IRRIG 3000ML ARTHROMATIC (IV SOLUTION) IMPLANT
KIT TURNOVER KIT A (KITS) ×2 IMPLANT
LABEL OR SOLS (LABEL) ×2 IMPLANT
MANIFOLD NEPTUNE II (INSTRUMENTS) ×2 IMPLANT
NEEDLE FILTER BLUNT 18X 1/2SAF (NEEDLE) ×1
NEEDLE FILTER BLUNT 18X1 1/2 (NEEDLE) ×1 IMPLANT
NEEDLE HYPO 25X1 1.5 SAFETY (NEEDLE) ×2 IMPLANT
NS IRRIG 500ML POUR BTL (IV SOLUTION) ×2 IMPLANT
PACK EXTREMITY ARMC (MISCELLANEOUS) ×2 IMPLANT
SOL PREP PVP 2OZ (MISCELLANEOUS) ×2
SOLUTION PREP PVP 2OZ (MISCELLANEOUS) ×1 IMPLANT
STOCKINETTE STRL 6IN 960660 (GAUZE/BANDAGES/DRESSINGS) ×2 IMPLANT
SUT ETHILON 3-0 FS-10 30 BLK (SUTURE) ×4
SUT PROLENE 4 0 PS 2 18 (SUTURE) ×2 IMPLANT
SUT VIC AB 3-0 SH 27 (SUTURE) ×1
SUT VIC AB 3-0 SH 27X BRD (SUTURE) ×1 IMPLANT
SUTURE EHLN 3-0 FS-10 30 BLK (SUTURE) ×2 IMPLANT
SWAB CULTURE AMIES ANAERIB BLU (MISCELLANEOUS) ×2 IMPLANT
SYR 10ML LL (SYRINGE) IMPLANT
WATER STERILE IRR 500ML POUR (IV SOLUTION) ×2 IMPLANT

## 2021-03-09 NOTE — Brief Op Note (Signed)
03/09/2021  7:45 PM  PATIENT:  Ashley Ellison  70 y.o. female  PRE-OPERATIVE DIAGNOSIS:  Amputation Right 5th Toe  POST-OPERATIVE DIAGNOSIS:  Amputation Right 5th Toe  PROCEDURE:  Procedure(s): AMPUTATION TOE-5th Toe (Right)  SURGEON:  Surgeon(s) and Role:    Felecia Shelling, DPM - Primary  PHYSICIAN ASSISTANT:   ASSISTANTS: none   ANESTHESIA:   local and IV sedation  EBL:  minimal   BLOOD ADMINISTERED:none  DRAINS: none   LOCAL MEDICATIONS USED:  MARCAINE    and LIDOCAINE   SPECIMEN:  No Specimen  DISPOSITION OF SPECIMEN:  N/A  COUNTS:  YES  TOURNIQUET:  * Missing tourniquet times found for documented tourniquets in log: [id number] *  DICTATION: .Reubin Milan Dictation  PLAN OF CARE: Admit to inpatient   PATIENT DISPOSITION:  PACU - hemodynamically stable.   Delay start of Pharmacological VTE agent (>24hrs) due to surgical blood loss or risk of bleeding: no

## 2021-03-09 NOTE — Anesthesia Postprocedure Evaluation (Signed)
Anesthesia Post Note  Patient: Ashley Ellison  Procedure(s) Performed: AMPUTATION TOE-5th Toe (Right: Toe)  Patient location during evaluation: PACU Anesthesia Type: General Level of consciousness: awake and alert Pain management: pain level controlled Vital Signs Assessment: post-procedure vital signs reviewed and stable Respiratory status: spontaneous breathing, nonlabored ventilation, respiratory function stable and patient connected to nasal cannula oxygen Cardiovascular status: blood pressure returned to baseline and stable Postop Assessment: no apparent nausea or vomiting Anesthetic complications: no   No notable events documented.   Last Vitals:  Vitals:   03/09/21 2002 03/09/21 2024  BP:  (!) 155/67  Pulse: 95 88  Resp: 18 16  Temp:  37.1 C  SpO2: 100% 99%    Last Pain:  Vitals:   03/09/21 2024  TempSrc: Oral  PainSc:                  Lenard Simmer

## 2021-03-09 NOTE — Consult Note (Signed)
   PODIATRY CONSULTATION  NAME Ashley Ellison MRN [id number] DOB 01/29/1951 DOA 03/08/2021   Reason for consult: osteomyelitis RT 5th toe Chief Complaint  Patient presents with   Wound Infection    History of present illness: 70 y.o. female PMHx diabetes mellitus, PVD presenting for right foot ulcer with infection.  Patient was last seen in the office on 03/07/2021 for worsening infection of the toe.  Patient was advised to go to the ED for admission.  Upon admission podiatry was consulted and she presents today for surgical amputation of the right fifth toe.  MRI was ordered and completed outpatient positive for findings of osteomyelitis.  Past Medical History:  Diagnosis Date   Diabetes mellitus without complication (HCC)    Hypertension     CBC Latest Ref Rng & Units 03/09/2021 03/08/2021 02/22/2021  WBC 4.0 - 10.5 K/uL 6.9 8.2 7.0  Hemoglobin 12.0 - 15.0 g/dL 10.8(L) 12.3 10.7(L)  Hematocrit 36.0 - 46.0 % 31.7(L) 36.3 30.4(L)  Platelets 150 - 400 K/uL 310 377 261    BMP Latest Ref Rng & Units 03/09/2021 03/08/2021 02/22/2021  Glucose 70 - 99 mg/dL 323(F) 573(U) 202(R)  BUN 8 - 23 mg/dL 13 16 13   Creatinine 0.44 - 1.00 mg/dL 4.27) 0.62(B  Sodium 135 - 145 mmol/L 140 139 136  Potassium 3.5 - 5.1 mmol/L 3.3(L) 4.0 3.6  Chloride 98 - 111 mmol/L 106 102 109  CO2 22 - 32 mmol/L 25 26 21(L)  Calcium 8.9 - 10.3 mg/dL 9.2 9.8 7.62)      Physical Exam: General: The patient is alert and oriented x3 in no acute distress.   Dermatology: Gangrenous changes noted localized to the right fifth toe.  Vascular: H/o lower extremity angiography 02/21/2021  Neurological: Light touch and protective threshold absent bilaterally.   Musculoskeletal Exam: No structural deformity noted.  MR foot right wo contrast 02/17/2021: IMPRESSION: 1. Bone marrow edema within the middle phalanx and distal phalanx of the right fifth toe without confluent low T1 bone marrow signal. Findings are  favored to represent reactive osteitis without definite evidence of acute osteomyelitis. 2. Generalized soft tissue edema. No organized fluid collection. 3. Mild osteoarthritis of the forefoot, most pronounced at the first MTP joint.    ASSESSMENT/PLAN OF CARE Osteomyelitis right fifth toe -Today we discussed the plan to proceed with right fifth toe amputation.  Patient agrees.  All possible complications and details the procedure were explained.  No guarantees were expressed or implied.  Attempt will be at the MTP joint with primary closure -Preoperative orders placed.  Patient n.p.o. currently -Continue IV antibiotics -Podiatry will follow     Thank you for the consult.  Please contact me directly with any questions or concerns.  Cell [phone]   [phone], DPM [facility]  Dr. Felecia Shelling, DPM    [address] [place], [place]                Office [phone]  Fax [phone]

## 2021-03-09 NOTE — Progress Notes (Incomplete)
Pt c/o pain, and pt bp elevated  meets parameters for PRN meds. Meds given.

## 2021-03-09 NOTE — Progress Notes (Signed)
PROGRESS NOTE    Ashley Ellison  SXJ:[id number] DOB: 01/15/1951 DOA: 03/08/2021 PCP: [name], MD    Brief Narrative:  70 y.o. female with medical history significant of hypertension, hyperlipidemia, diabetes mellitus, PVD, who presents with right foot ulcer with infection.   Patient states that he has a chronic right foot diabetic foot ulcer in fifth toe.  He has been follow-up with the podiatrist. Pt was seen by her podiatrist a week ago and was told that she has surrounding infection of a chronic right fifth digit wound. She was started on Keflex yesterday, no improvement. Pt was sent to ED by her podiatrist, Dr. Lilian Kapur for IV antibiotics.  Patient has mild to moderate right foot pain, no fever or chills.  N.p.o. for operative intervention 11/4   Assessment & Plan:   Principal Problem:   Right foot infection Active Problems:   Hypertension associated with diabetes (HCC)   AKI (acute kidney injury) (HCC)   Hyperlipidemia associated with type 2 diabetes mellitus (HCC)   Diabetic foot ulcer (HCC)   Peripheral arterial disease (HCC)   Type 2 diabetes mellitus with other specified complication (HCC)   Toe infection  Right foot infection and diabetic foot ulcer in right 5th toe pt failed outpatient Keflex treatment.   Likely need surgery.   Dr. Lilian Kapur of [facility] is consulted Plan: Continue IV empiric therapy As needed pain control As needed antiemetics Monitor vitals and fever curve Monitor cultures Likely to OR today Podiatry and wound care consults    Hypertension associated with diabetes (HCC) -IV hydralazine as needed -Oral hydralazine 25 mg 3 times daily   AKI (acute kidney injury) (HCC)  Mild, likely due to dehydration - continue gentle IVF   Hyperlipidemia associated with type 2 diabetes mellitus (HCC) -Lipitor   Peripheral arterial disease (HCC) -Continue Lipitor -Hold Plavix for surgery.  Can likely start postop day 1   Type 2 diabetes  mellitus with other specified complication [facility]): Recent A1c 10.3, poorly controlled.  Patient taking glipizide.  Glipizide on hold -Sliding scale insulin   DVT prophylaxis: SQ heparin Code Status: Full Family Communication: None today Disposition Plan: Status is: Inpatient  Remains inpatient appropriate because: Infected right fifth toe.  Awaiting podiatry follow-up       Level of care: Med-Surg  Consultants:  Podiatry  Procedures:  None  Antimicrobials: Vancomycin Ceftriaxone   Subjective: Patient seen and examined.  Resting comfortably in bed.  No visible distress.  Does report endorse some pain infected toe.  Objective: Vitals:   03/09/21 0352 03/09/21 0500 03/09/21 0715 03/09/21 1013  BP: (!) 169/58 (!) 156/77 (!) 166/69 (!) 158/65  Pulse: 90 87 92 90  Resp: 17 18 18 16   Temp: 98.6 F (37 C)  98.5 F (36.9 C) 99.1 F (37.3 C)  TempSrc: Oral  Oral Oral  SpO2: 99% 100% 100% 100%  Weight:      Height:        Intake/Output Summary (Last 24 hours) at 03/09/2021 1306 Last data filed at 03/09/2021 0800 Gross per 24 hour  Intake --  Output 0 ml  Net 0 ml   Filed Weights   03/08/21 0924  Weight: 81.6 kg    Examination:  General exam: Appears calm and comfortable  Respiratory system: Clear to auscultation. Respiratory effort normal. Cardiovascular system: S1-S2, RRR, no murmurs, no pedal edema Gastrointestinal system: Soft, NT/ND, normal bowel sounds Central nervous system: Alert and oriented. No focal neurological deficits. Extremities: Symmetric 5 x 5  power.  Right fifth toe dark in color Skin: No rashes, lesions or ulcers Psychiatry: Judgement and insight appear normal. Mood & affect appropriate.     Data Reviewed: I have personally reviewed following labs and imaging studies  CBC: Recent Labs  Lab 03/08/21 1028 03/09/21 0523  WBC 8.2 6.9  NEUTROABS 5.1  --   HGB 12.3 10.8*  HCT 36.3 31.7*  MCV 92.4 92.2  PLT 377 310   Basic Metabolic  Panel: Recent Labs  Lab 03/08/21 1028 03/09/21 0523  NA 139 140  K 4.0 3.3*  CL 102 106  CO2 26 25  GLUCOSE 211* 152*  BUN 16 13  CREATININE 1.13* 0.71  CALCIUM 9.8 9.2   GFR: Estimated Creatinine Clearance: 70.9 mL/min (by C-G formula based on SCr of 0.71 mg/dL). Liver Function Tests: Recent Labs  Lab 03/08/21 1028  AST 17  ALT 13  ALKPHOS 44  BILITOT 0.8  PROT 8.7*  ALBUMIN 3.6   No results for input(s): LIPASE, AMYLASE in the last 168 hours. No results for input(s): AMMONIA in the last 168 hours. Coagulation Profile: Recent Labs  Lab 03/08/21 2053  INR 1.0   Cardiac Enzymes: No results for input(s): CKTOTAL, CKMB, CKMBINDEX, TROPONINI in the last 168 hours. BNP (last 3 results) No results for input(s): PROBNP in the last 8760 hours. HbA1C: No results for input(s): HGBA1C in the last 72 hours. CBG: Recent Labs  Lab 03/08/21 1701 03/08/21 2202 03/09/21 0716 03/09/21 1149  GLUCAP 166* 135* 150* 126*   Lipid Profile: No results for input(s): CHOL, HDL, LDLCALC, TRIG, CHOLHDL, LDLDIRECT in the last 72 hours. Thyroid Function Tests: No results for input(s): TSH, T4TOTAL, FREET4, T3FREE, THYROIDAB in the last 72 hours. Anemia Panel: No results for input(s): VITAMINB12, FOLATE, FERRITIN, TIBC, IRON, RETICCTPCT in the last 72 hours. Sepsis Labs: Recent Labs  Lab 03/08/21 1028  LATICACIDVEN 1.5    Recent Results (from the past 240 hour(s))  Blood culture (routine x 2)     Status: None (Preliminary result)   Collection Time: 03/08/21 11:40 AM   Specimen: BLOOD  Result Value Ref Range Status   Specimen Description BLOOD RAC  Final   Special Requests   Final    BOTTLES DRAWN AEROBIC AND ANAEROBIC Blood Culture adequate volume   Culture   Final    NO GROWTH < 24 HOURS Performed at [facility], [address] [place], [place] [address]    Report Status PENDING  Incomplete  Blood culture (routine x 2)     Status: None (Preliminary result)    Collection Time: 03/08/21 11:41 AM   Specimen: BLOOD  Result Value Ref Range Status   Specimen Description BLOOD LAC  Final   Special Requests   Final    BOTTLES DRAWN AEROBIC AND ANAEROBIC Blood Culture adequate volume   Culture   Final    NO GROWTH < 24 HOURS Performed at [facility], [address] [place], [place] [address]    Report Status PENDING  Incomplete  Resp Panel by RT-PCR (Flu A&B, Covid) Nasopharyngeal Swab     Status: None   Collection Time: 03/08/21  5:16 PM   Specimen: Nasopharyngeal Swab; Nasopharyngeal(NP) swabs in vial transport medium  Result Value Ref Range Status   SARS Coronavirus 2 by RT PCR NEGATIVE NEGATIVE Final    Comment: (NOTE) SARS-CoV-2 target nucleic acids are NOT DETECTED.  The SARS-CoV-2 RNA is generally detectable in upper respiratory specimens during the acute phase of infection.  The lowest concentration of SARS-CoV-2 viral copies this assay can detect is 138 copies/mL. A negative result does not preclude SARS-Cov-2 infection and should not be used as the sole basis for treatment or other patient management decisions. A negative result may occur with  improper specimen collection/handling, submission of specimen other than nasopharyngeal swab, presence of viral mutation(s) within the areas targeted by this assay, and inadequate number of viral copies(<138 copies/mL). A negative result must be combined with clinical observations, patient history, and epidemiological information. The expected result is Negative.  Fact Sheet for Patients:  BloggerCourse.com  Fact Sheet for Healthcare Providers:  SeriousBroker.it  This test is no t yet approved or cleared by the [place] FDA and  has been authorized for detection and/or diagnosis of SARS-CoV-2 by FDA under an Emergency Use Authorization (EUA). This EUA will remain  in effect (meaning this test can be used) for the duration of  the COVID-19 declaration under Section 564(b)(1) of the Act, 21 U.S.C.section 360bbb-3(b)(1), unless the authorization is terminated  or revoked sooner.       Influenza A by PCR NEGATIVE NEGATIVE Final   Influenza B by PCR NEGATIVE NEGATIVE Final    Comment: (NOTE) The Xpert Xpress SARS-CoV-2/FLU/RSV plus assay is intended as an aid in the diagnosis of influenza from Nasopharyngeal swab specimens and should not be used as a sole basis for treatment. Nasal washings and aspirates are unacceptable for Xpert Xpress SARS-CoV-2/FLU/RSV testing.  Fact Sheet for Patients: BloggerCourse.com  Fact Sheet for Healthcare Providers: SeriousBroker.it  This test is not yet approved or cleared by the [place] FDA and has been authorized for detection and/or diagnosis of SARS-CoV-2 by FDA under an Emergency Use Authorization (EUA). This EUA will remain in effect (meaning this test can be used) for the duration of the COVID-19 declaration under Section 564(b)(1) of the Act, 21 U.S.C. section 360bbb-3(b)(1), unless the authorization is terminated or revoked.  Performed at [facility], [address] [place], [place] [address]          Radiology Studies: DG Foot Complete Right  Result Date: 03/08/2021 CLINICAL DATA:  Fifth toe infection EXAM: RIGHT FOOT COMPLETE - 3+ VIEW COMPARISON:  None. FINDINGS: Alignment is anatomic. No acute fracture. Joint spaces are preserved. No erosive changes or periosteal reaction identified. Extensive vascular calcification. IMPRESSION: No evidence of osteomyelitis by radiograph. Electronically Signed   By: Guadlupe Spanish M.D.   On: 03/08/2021 11:24        Scheduled Meds:  atorvastatin  10 mg Oral QHS   heparin  5,000 Units Subcutaneous Q8H   hydrALAZINE  25 mg Oral Q8H   insulin aspart  0-5 Units Subcutaneous QHS   insulin aspart  0-9 Units Subcutaneous TID WC   Continuous Infusions:   cefTRIAXone (ROCEPHIN)  IV 1 g (03/09/21 1023)   vancomycin 1,500 mg (03/09/21 1134)     LOS: 0 days    Time spent: 25 minutes    Tresa Moore, MD [facility]   If 7PM-7AM, please contact night-coverage  03/09/2021, 1:06 PM

## 2021-03-09 NOTE — Consult Note (Signed)
Pharmacy Antibiotic Note  Ashley Ellison is a 70 y.o. female with PMH of DM II, HTN, tobacco abuse admitted on 03/08/2021 with cellulitis/chronic right foot diabetic foot ulcer in fifth toe.  Pharmacy has been consulted for vancomycin dosing. Her renal function today appears to be at apparent baseline level.  Plan: continue vancomycin 1500 mg IV Q 24 hrs  Goal AUC 400-550 Expected AUC: 452 SCr used: 0.71 mg/dL Assess renal function daily and adjust dose as clinically indicated Obtain vancomycin levels around steady state if continued Follow up cultures   Height: 5\' 7"  (170.2 cm) Weight: 81.6 kg (180 lb) IBW/kg (Calculated) : 61.6  Temp (24hrs), Avg:98.7 F (37.1 C), Min:98.5 F (36.9 C), Max:99 F (37.2 C)  Recent Labs  Lab 03/08/21 1028 03/09/21 0523  WBC 8.2 6.9  CREATININE 1.13* 0.71  LATICACIDVEN 1.5  --      Estimated Creatinine Clearance: 70.9 mL/min (by C-G formula based on SCr of 0.71 mg/dL).    No Known Allergies  Antimicrobials this admission: 11/3 ceftriaxone >>  11/3 vancomycin >>   Microbiology results: 11/3 BCx: NG < 24 hours 11/3 SARS Cov-2: negative 11/3 influenza A/B: negative  Thank you for allowing pharmacy to be a part of this patient's care.  13/3 PharmD, BCPS 03/09/2021 8:00 AM

## 2021-03-09 NOTE — Transfer of Care (Signed)
Immediate Anesthesia Transfer of Care Note  Patient: [name]  Procedure(s) Performed: AMPUTATION TOE-5th Toe (Right: Toe)  Patient Location: PACU  Anesthesia Type:General  Level of Consciousness: awake, alert  and oriented  Airway & Oxygen Therapy: Patient Spontanous Breathing  Post-op Assessment: Report given to RN and Post -op Vital signs reviewed and stable  Post vital signs: Reviewed and stable  Last Vitals:  Vitals Value Taken Time  BP 169/62 03/09/21 1945  Temp 36.5 C 03/09/21 1944  Pulse 100 03/09/21 1946  Resp 22 03/09/21 1946  SpO2 100 % 03/09/21 1946  Vitals shown include unvalidated device data.  Last Pain:  Vitals:   03/09/21 1944  TempSrc:   PainSc: 0-No pain         Complications: No notable events documented.

## 2021-03-09 NOTE — Plan of Care (Signed)
Pt admitted for cellulitis of right great toe.BP (!) 168/57 (BP Location: Right Arm)   Pulse 79   Temp 99 F (37.2 C) (Oral)   Resp 16   Ht 5\' 7"  (1.702 m)   Wt 81.6 kg   SpO2 100%   BMI 28.19 kg/m .  Pt had no c/o pain at this time. Pt oriented to floor and welcomed by peers and staff. Pt bed in lowest position, call bell nearby and bed alarm on. B Moncus 03/09/21 1:19 AM

## 2021-03-09 NOTE — Anesthesia Preprocedure Evaluation (Deleted)
Anesthesia Evaluation    Airway        Dental   Pulmonary           Cardiovascular hypertension, + Peripheral Vascular Disease (on Plavix)    ECG 03/08/21:  Normal sinus rhythm with sinus arrhythmia Septal infarct , age undetermined   Neuro/Psych    GI/Hepatic   Endo/Other  diabetes, Type 2  Renal/GU Renal disease (AKI)     Musculoskeletal Chronic diabetic ulcer right foot   Abdominal   Peds  Hematology   Anesthesia Other Findings   Reproductive/Obstetrics                             Anesthesia Physical Anesthesia Plan Anesthesia Quick Evaluation

## 2021-03-09 NOTE — Anesthesia Preprocedure Evaluation (Signed)
Anesthesia Evaluation  Patient identified by MRN, date of birth, ID band Patient awake    Reviewed: Allergy & Precautions, H&P , NPO status , Patient's Chart, lab work & pertinent test results, reviewed documented beta blocker date and time   History of Anesthesia Complications Negative for: history of anesthetic complications  Airway Mallampati: I  TM Distance: >3 FB Neck ROM: full    Dental  (+) Dental Advidsory Given, Edentulous Upper, Edentulous Lower   Pulmonary neg pulmonary ROS,    Pulmonary exam normal breath sounds clear to auscultation       Cardiovascular Exercise Tolerance: Good hypertension, (-) angina+ Peripheral Vascular Disease  (-) Past MI and (-) Cardiac Stents Normal cardiovascular exam(-) dysrhythmias (-) Valvular Problems/Murmurs Rhythm:regular Rate:Normal     Neuro/Psych negative neurological ROS  negative psych ROS   GI/Hepatic negative GI ROS, Neg liver ROS,   Endo/Other  diabetes, Oral Hypoglycemic Agents  Renal/GU negative Renal ROS  negative genitourinary   Musculoskeletal   Abdominal   Peds  Hematology negative hematology ROS (+)   Anesthesia Other Findings Past Medical History: No date: Diabetes mellitus without complication (HCC) No date: Hypertension   Reproductive/Obstetrics negative OB ROS                             Anesthesia Physical Anesthesia Plan  ASA: 3  Anesthesia Plan: General   Post-op Pain Management:    Induction: Intravenous  PONV Risk Score and Plan: 3 and TIVA and Propofol infusion  Airway Management Planned: Natural Airway and Simple Face Mask  Additional Equipment:   Intra-op Plan:   Post-operative Plan:   Informed Consent: I have reviewed the patients History and Physical, chart, labs and discussed the procedure including the risks, benefits and alternatives for the proposed anesthesia with the patient or authorized  representative who has indicated his/her understanding and acceptance.     Dental Advisory Given  Plan Discussed with: Anesthesiologist, CRNA and Surgeon  Anesthesia Plan Comments:         Anesthesia Quick Evaluation

## 2021-03-10 ENCOUNTER — Encounter: Payer: Self-pay | Admitting: Podiatry

## 2021-03-10 DIAGNOSIS — L089 Local infection of the skin and subcutaneous tissue, unspecified: Secondary | ICD-10-CM | POA: Diagnosis not present

## 2021-03-10 LAB — GLUCOSE, CAPILLARY
Glucose-Capillary: 88 mg/dL (ref 70–99)
Glucose-Capillary: 155 mg/dL — ABNORMAL HIGH (ref 70–99)
Glucose-Capillary: 144 mg/dL — ABNORMAL HIGH (ref 70–99)
Glucose-Capillary: 137 mg/dL — ABNORMAL HIGH (ref 70–99)

## 2021-03-10 LAB — CREATININE, SERUM
Creatinine, Ser: 0.86 mg/dL (ref 0.44–1.00)
GFR, Estimated: >60 mL/min (ref >60)

## 2021-03-10 MED ORDER — CLOPIDOGREL BISULFATE 75 MG PO TABS
75.0000 mg | ORAL_TABLET | Freq: Every day | ORAL | Status: DC
  Administered 2021-03-10 – 2021-03-11 (×2): 75 mg via ORAL
  Filled 2021-03-10 (×2): qty 1

## 2021-03-10 MED ORDER — MORPHINE SULFATE (PF) 2 MG/ML IV SOLN
2.0000 mg | INTRAVENOUS | Status: DC | PRN
  Administered 2021-03-10 – 2021-03-11 (×3): 2 mg via INTRAVENOUS
  Filled 2021-03-10 (×3): qty 1

## 2021-03-10 MED ORDER — AMOXICILLIN-POT CLAVULANATE 875-125 MG PO TABS
1.0000 | ORAL_TABLET | Freq: Two times a day (BID) | ORAL | Status: DC
  Administered 2021-03-10 – 2021-03-11 (×3): 1 via ORAL
  Filled 2021-03-10 (×3): qty 1

## 2021-03-10 NOTE — Evaluation (Signed)
Physical Therapy Evaluation Patient Details Name: Ashley Ellison MRN: [id number] DOB: December 05, 1950 Today's Date: 03/10/2021  History of Present Illness  Pt is a 70 y/o F admitted on 03/08/21 after presenting with R foot ulcer with infection. Pt underwent amputation of R 5th toe on 03/09/21. PMH: HTN, HLD, DM, PVD  Clinical Impression  Pt seen for PT evaluation with PT educating pt on WBAT in post op shoe. Prior to admission pt was independent without AD, driving, & denies falls. On this date pt is able to complete bed mobility with mod I & ambulate 2 laps around nurses station with CGA progressing to mod I. At this time pt does not demonstrate any acute PT needs - PT to sign off & pt in agreement.        Recommendations for follow up therapy are one component of a multi-disciplinary discharge planning process, led by the attending physician.  Recommendations may be updated based on patient status, additional functional criteria and insurance authorization.  Follow Up Recommendations No PT follow up    Assistance Recommended at Discharge None  Functional Status Assessment Patient has not had a recent decline in their functional status  Equipment Recommendations  None recommended by PT    Recommendations for Other Services       Precautions / Restrictions Precautions Precautions: None Restrictions Weight Bearing Restrictions: Yes RLE Weight Bearing: Weight bearing as tolerated (in post op shoe)      Mobility  Bed Mobility Overal bed mobility: Modified Independent             General bed mobility comments: supine>sit with HOB slightly elevated    Transfers Overall transfer level: Needs assistance Equipment used: None Transfers: Sit to/from Stand Sit to Stand: Supervision;Min guard (upon initial sit>stand after not being out of bed for 2 days)                Ambulation/Gait Ambulation/Gait assistance:  (CGA progressed to mod I without AD) Gait Distance (Feet):  350 Feet Assistive device: None Gait Pattern/deviations: Decreased dorsiflexion - right Gait velocity: slightly decreased   General Gait Details: decreased heel strike RLE, wore tennis shoe on LLE & post op shoe on RLE  Stairs            Wheelchair Mobility    Modified Rankin (Stroke Patients Only)       Balance Overall balance assessment: Needs assistance Sitting-balance support: No upper extremity supported;Feet supported Sitting balance-Leahy Scale: Normal     Standing balance support: No upper extremity supported;During functional activity Standing balance-Leahy Scale: Good                               Pertinent Vitals/Pain Pain Assessment: No/denies pain    Home Living Family/patient expects to be discharged to:: Private residence Living Arrangements: Spouse/significant other;Children Available Help at Discharge: Family (daughter works during the day, husband home during the day) Type of Home: House Home Access: Level entry (small threshold entry)       Home Layout: One level Home Equipment:  (has access to RW & cane that she can use (reports they belong to her husband but he only uses it occasionally))      Prior Function Prior Level of Function : Independent/Modified Independent             Mobility Comments: independent without AD, driving, denies falls in the past 6 months  Hand Dominance        Extremity/Trunk Assessment   Upper Extremity Assessment Upper Extremity Assessment: Overall WFL for tasks assessed    Lower Extremity Assessment Lower Extremity Assessment: Overall WFL for tasks assessed       Communication   Communication: No difficulties  Cognition Arousal/Alertness: Awake/alert Behavior During Therapy: WFL for tasks assessed/performed Overall Cognitive Status: Within Functional Limits for tasks assessed                                          General Comments      Exercises      Assessment/Plan    PT Assessment Patient does not need any further PT services  PT Problem List         PT Treatment Interventions      PT Goals (Current goals can be found in the Care Plan section)  Acute Rehab PT Goals Patient Stated Goal: to go home PT Goal Formulation: With patient Time For Goal Achievement: 03/24/21 Potential to Achieve Goals: Good    Frequency     Barriers to discharge        Co-evaluation               AM-PAC PT "6 Clicks" Mobility  Outcome Measure Help needed turning from your back to your side while in a flat bed without using bedrails?: None Help needed moving from lying on your back to sitting on the side of a flat bed without using bedrails?: None Help needed moving to and from a bed to a chair (including a wheelchair)?: None Help needed standing up from a chair using your arms (e.g., wheelchair or bedside chair)?: None Help needed to walk in hospital room?: None Help needed climbing 3-5 steps with a railing? : A Little 6 Click Score: 23    End of Session Equipment Utilized During Treatment: Gait belt Activity Tolerance: Patient tolerated treatment well Patient left: in chair;with chair alarm set;with call bell/phone within reach Nurse Communication: Mobility status      Time: 7591-6384 PT Time Calculation (min) (ACUTE ONLY): 18 min   Charges:   PT Evaluation $PT Eval Low Complexity: 1 Low          Aleda Grana, PT, DPT 03/10/21, 9:52 AM   Sandi Mariscal 03/10/2021, 9:51 AM

## 2021-03-10 NOTE — Progress Notes (Signed)
   PODIATRY PROGRESS NOTE  NAME Ashley Ellison MRN [id number] DOB 05/17/1950 DOA 03/08/2021  Patient status post fifth toe amputation RT foot with recent lower extremity angiography 02/21/2021 presenting POV #1.  Patient states that she feels well.  Tolerated the procedure well.  I explained to the patient that there was minimal bleeding during surgery yesterday evening.  Patient understands.  Minimal purulence noted as this seems to be more related to PAD versus infection.  Explained to the patient that the potential for healing is completely up to the circulation in her foot. She understands that she remains high risk for limb loss but would like to remain optimistic that the foot has the potential for healing.  Assessment/plan: 1.  S/p fifth toe amputation RT foot.  03/10/2021 -Amputation site appears well coapted with sutures intact.  No drainage. -Dressings changed.  Keep clean dry and intact until follow-up in the office -Negative for any significant infection or purulence.  May discharge on p.o. antibiotics -From a podiatry/surgical standpoint patient okay to be discharged -Weightbearing as tolerated    Chief Complaint  Patient presents with   Wound Infection   Past Medical History:  Diagnosis Date   Diabetes mellitus without complication (HCC)    Hypertension     CBC Latest Ref Rng & Units 03/09/2021 03/08/2021 02/22/2021  WBC 4.0 - 10.5 K/uL 6.9 8.2 7.0  Hemoglobin 12.0 - 15.0 g/dL 10.8(L) 12.3 10.7(L)  Hematocrit 36.0 - 46.0 % 31.7(L) 36.3 30.4(L)  Platelets 150 - 400 K/uL 310 377 261    BMP Latest Ref Rng & Units 03/10/2021 03/09/2021 03/08/2021  Glucose 70 - 99 mg/dL - 147(W) 295(A)  BUN 8 - 23 mg/dL - 13 16  Creatinine 2.13 - 1.00 mg/dL 0.86 5.78 4.69(G)  Sodium 135 - 145 mmol/L - 140 139  Potassium 3.5 - 5.1 mmol/L - 3.3(L) 4.0  Chloride 98 - 111 mmol/L - 106 102  CO2 22 - 32 mmol/L - 25 26  Calcium 8.9 - 10.3 mg/dL - 9.2 9.8   Please contact me directly with  any questions or concerns.  Cell [phone]   Felecia Shelling, DPM [facility]  Dr. Felecia Shelling, DPM    [address] [place], [place] [address]                Office [phone]  Fax [phone]

## 2021-03-10 NOTE — Progress Notes (Signed)
OT Cancellation Note  Patient Details Name: Ashley Ellison MRN: [id number] DOB: [birth year]   Cancelled Treatment:    Reason Eval/Treat Not Completed: OT screened, no needs identified, will sign off. Order received, chart reviewed. Per conversation with pt, all questions answered, no current OT needs. Pt back to baseline functional independence. No skilled OT needs identified. Will sign off. Please re-consult if additional needs arise.    [facility], OTS   [facility] 03/10/2021, 10:19 AM

## 2021-03-10 NOTE — Progress Notes (Signed)
PROGRESS NOTE    Ashley Ellison  GGY:[id number] DOB: 1950-08-13 DOA: 03/08/2021 PCP: [name], MD    Brief Narrative:  70 y.o. female with medical history significant of hypertension, hyperlipidemia, diabetes mellitus, PVD, who presents with right foot ulcer with infection.   Patient states that he has a chronic right foot diabetic foot ulcer in fifth toe.  He has been follow-up with the podiatrist. Pt was seen by her podiatrist a week ago and was told that she has surrounding infection of a chronic right fifth digit wound. She was started on Keflex yesterday, no improvement. Pt was sent to ED by her podiatrist, Dr. Lilian Kapur for IV antibiotics.  Patient has mild to moderate right foot pain, no fever or chills.  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  Tolerated procedure well.  Discussed case with podiatry.  Very minimal bleeding around the site.  Issue likely more vascular in nature.  Patient missed a follow-up appointment with endovascular services.  We will need to reschedule   Assessment & Plan:   Principal Problem:   Right foot infection Active Problems:   Hypertension associated with diabetes (HCC)   AKI (acute kidney injury) (HCC)   Hyperlipidemia associated with type 2 diabetes mellitus (HCC)   Diabetic foot ulcer (HCC)   Peripheral arterial disease (HCC)   Type 2 diabetes mellitus with other specified complication (HCC)   Toe infection  Right foot infection and diabetic foot ulcer in right 5th toe pt failed outpatient Keflex treatment.   Likely need surgery.   Dr. Lilian Kapur of [facility] is consulted Status post right fifth toe amputation on 11/4 Tolerated procedure well, minimal bleeding Postoperative pain well controlled, working well physical therapy Plan: Discontinue IV antibiotics Start p.o. Augmentin Ensure pain control Monitor vitals and fever curve Anticipated date of discharge 11/6     Hypertension associated with diabetes (HCC) -IV  hydralazine as needed -Oral hydralazine 25 mg 3 times daily   AKI (acute kidney injury) (HCC)  Mild, likely due to dehydration -IVF discontinued   Hyperlipidemia associated with type 2 diabetes mellitus (HCC) -Lipitor   Peripheral arterial disease (HCC) -Continue Lipitor -Can restart Plavix today -Needs outpatient follow-up with vein and vascular services   Type 2 diabetes mellitus with other specified complication [facility]): Recent A1c 10.3, poorly controlled.  Patient taking glipizide.  Glipizide on hold -Sliding scale insulin   DVT prophylaxis: SQ heparin Code Status: Full Family Communication: None today Disposition Plan: Status is: Inpatient  Remains inpatient appropriate because: Status post right fifth toe amputation for osteomyelitis.  Ensure pain control.  Restart antiplatelet.  If stable anticipate discharge 11/6.       Level of care: Med-Surg  Consultants:  Podiatry  Procedures:  None  Antimicrobials: Vancomycin Ceftriaxone   Subjective: Patient seen and examined.  Resting comfortably in bed.  No visible distress.  Postoperative pain mild to moderate Objective: Vitals:   03/09/21 2109 03/09/21 2204 03/10/21 0604 03/10/21 0708  BP: (!) 162/65 (!) 173/56 (!) 141/63 (!) 149/63  Pulse: 92 94 91 73  Resp: 20 16 16 18   Temp: 97.8 F (36.6 C) 99.4 F (37.4 C) 98.6 F (37 C) 98.6 F (37 C)  TempSrc: Oral Oral Oral   SpO2: 97% 98% 99% 96%  Weight:      Height:        Intake/Output Summary (Last 24 hours) at 03/10/2021 1356 Last data filed at 03/10/2021 0644 Gross per 24 hour  Intake 400 ml  Output 405  ml  Net -5 ml   Filed Weights   03/08/21 0924  Weight: 81.6 kg    Examination:  General exam: No acute distress.  Resting comfortably in bed Respiratory system: Clear to auscultation. Respiratory effort normal. Cardiovascular system: S1-S2, RRR, no murmurs, no pedal edema Gastrointestinal system: Soft, NT/ND, normal bowel sounds Central  nervous system: Alert and oriented. No focal neurological deficits. Extremities: Symmetric 5 x 5 power.  Status post right fifth toe amputation Skin: No rashes, lesions or ulcers Psychiatry: Judgement and insight appear normal. Mood & affect appropriate.     Data Reviewed: I have personally reviewed following labs and imaging studies  CBC: Recent Labs  Lab 03/08/21 1028 03/09/21 0523  WBC 8.2 6.9  NEUTROABS 5.1  --   HGB 12.3 10.8*  HCT 36.3 31.7*  MCV 92.4 92.2  PLT 377 310   Basic Metabolic Panel: Recent Labs  Lab 03/08/21 1028 03/09/21 0523 03/10/21 0418  NA 139 140  --   K 4.0 3.3*  --   CL 102 106  --   CO2 26 25  --   GLUCOSE 211* 152*  --   BUN 16 13  --   CREATININE 1.13* 0.71 0.86  CALCIUM 9.8 9.2  --    GFR: Estimated Creatinine Clearance: 65.9 mL/min (by C-G formula based on SCr of 0.86 mg/dL). Liver Function Tests: Recent Labs  Lab 03/08/21 1028  AST 17  ALT 13  ALKPHOS 44  BILITOT 0.8  PROT 8.7*  ALBUMIN 3.6   No results for input(s): LIPASE, AMYLASE in the last 168 hours. No results for input(s): AMMONIA in the last 168 hours. Coagulation Profile: Recent Labs  Lab 03/08/21 2053  INR 1.0   Cardiac Enzymes: No results for input(s): CKTOTAL, CKMB, CKMBINDEX, TROPONINI in the last 168 hours. BNP (last 3 results) No results for input(s): PROBNP in the last 8760 hours. HbA1C: No results for input(s): HGBA1C in the last 72 hours. CBG: Recent Labs  Lab 03/09/21 1149 03/09/21 1945 03/09/21 2149 03/10/21 0709 03/10/21 1129  GLUCAP 126* 124* 117* 88 155*   Lipid Profile: No results for input(s): CHOL, HDL, LDLCALC, TRIG, CHOLHDL, LDLDIRECT in the last 72 hours. Thyroid Function Tests: No results for input(s): TSH, T4TOTAL, FREET4, T3FREE, THYROIDAB in the last 72 hours. Anemia Panel: No results for input(s): VITAMINB12, FOLATE, FERRITIN, TIBC, IRON, RETICCTPCT in the last 72 hours. Sepsis Labs: Recent Labs  Lab 03/08/21 1028   LATICACIDVEN 1.5    Recent Results (from the past 240 hour(s))  Blood culture (routine x 2)     Status: None (Preliminary result)   Collection Time: 03/08/21 11:40 AM   Specimen: BLOOD  Result Value Ref Range Status   Specimen Description BLOOD RAC  Final   Special Requests   Final    BOTTLES DRAWN AEROBIC AND ANAEROBIC Blood Culture adequate volume   Culture   Final    NO GROWTH 2 DAYS Performed at [facility] [address] [place], [place] [address]    Report Status PENDING  Incomplete  Blood culture (routine x 2)     Status: None (Preliminary result)   Collection Time: 03/08/21 11:41 AM   Specimen: BLOOD  Result Value Ref Range Status   Specimen Description BLOOD LAC  Final   Special Requests   Final    BOTTLES DRAWN AEROBIC AND ANAEROBIC Blood Culture adequate volume   Culture   Final    NO GROWTH 2 DAYS Performed at [facility],  [address], [place], [place] [address]    Report Status PENDING  Incomplete  Resp Panel by RT-PCR (Flu A&B, Covid) Nasopharyngeal Swab     Status: None   Collection Time: 03/08/21  5:16 PM   Specimen: Nasopharyngeal Swab; Nasopharyngeal(NP) swabs in vial transport medium  Result Value Ref Range Status   SARS Coronavirus 2 by RT PCR NEGATIVE NEGATIVE Final    Comment: (NOTE) SARS-CoV-2 target nucleic acids are NOT DETECTED.  The SARS-CoV-2 RNA is generally detectable in upper respiratory specimens during the acute phase of infection. The lowest concentration of SARS-CoV-2 viral copies this assay can detect is 138 copies/mL. A negative result does not preclude SARS-Cov-2 infection and should not be used as the sole basis for treatment or other patient management decisions. A negative result may occur with  improper specimen collection/handling, submission of specimen other than nasopharyngeal swab, presence of viral mutation(s) within the areas targeted by this assay, and inadequate number of viral copies(<138  copies/mL). A negative result must be combined with clinical observations, patient history, and epidemiological information. The expected result is Negative.  Fact Sheet for Patients:  BloggerCourse.com  Fact Sheet for Healthcare Providers:  SeriousBroker.it  This test is no t yet approved or cleared by the [place] FDA and  has been authorized for detection and/or diagnosis of SARS-CoV-2 by FDA under an Emergency Use Authorization (EUA). This EUA will remain  in effect (meaning this test can be used) for the duration of the COVID-19 declaration under Section 564(b)(1) of the Act, 21 U.S.C.section 360bbb-3(b)(1), unless the authorization is terminated  or revoked sooner.       Influenza A by PCR NEGATIVE NEGATIVE Final   Influenza B by PCR NEGATIVE NEGATIVE Final    Comment: (NOTE) The Xpert Xpress SARS-CoV-2/FLU/RSV plus assay is intended as an aid in the diagnosis of influenza from Nasopharyngeal swab specimens and should not be used as a sole basis for treatment. Nasal washings and aspirates are unacceptable for Xpert Xpress SARS-CoV-2/FLU/RSV testing.  Fact Sheet for Patients: BloggerCourse.com  Fact Sheet for Healthcare Providers: SeriousBroker.it  This test is not yet approved or cleared by the [place] FDA and has been authorized for detection and/or diagnosis of SARS-CoV-2 by FDA under an Emergency Use Authorization (EUA). This EUA will remain in effect (meaning this test can be used) for the duration of the COVID-19 declaration under Section 564(b)(1) of the Act, 21 U.S.C. section 360bbb-3(b)(1), unless the authorization is terminated or revoked.  Performed at [facility], [address] [place], [place] [address]   Aerobic/Anaerobic Culture w Gram Stain (surgical/deep wound)     Status: None (Preliminary result)   Collection Time: 03/09/21   7:25 PM   Specimen: PATH Other; Wound  Result Value Ref Range Status   Specimen Description   Final    WOUND RIGHT 5TH TOE CULTURE Performed at [facility], [address], [place], [place] [address]    Special Requests   Final    NONE Performed at [facility], [address] [place], [place] [address]    Gram Stain   Final    NO SQUAMOUS EPITHELIAL CELLS SEEN FEW WBC SEEN NO ORGANISMS SEEN Performed at [facility], [address] [place], [place] [address]    Culture PENDING  Incomplete   Report Status PENDING  Incomplete         Radiology Studies: No results found.      Scheduled Meds:  amoxicillin-clavulanate  1 tablet Oral Q12H   atorvastatin  10 mg Oral QHS   heparin  5,000 Units Subcutaneous Q8H   hydrALAZINE  25 mg Oral Q8H   insulin aspart  0-5 Units Subcutaneous QHS   insulin aspart  0-9 Units Subcutaneous TID WC   Continuous Infusions:     LOS: 1 day    Time spent: 25 minutes    Tresa Moore, MD [facility]   If 7PM-7AM, please contact night-coverage  03/10/2021, 1:56 PM

## 2021-03-11 DIAGNOSIS — L089 Local infection of the skin and subcutaneous tissue, unspecified: Secondary | ICD-10-CM | POA: Diagnosis not present

## 2021-03-11 LAB — CREATININE, SERUM
Creatinine, Ser: 0.78 mg/dL (ref 0.44–1.00)
GFR, Estimated: >60 mL/min

## 2021-03-11 LAB — GLUCOSE, CAPILLARY: Glucose-Capillary: 121 mg/dL — ABNORMAL HIGH (ref 70–99)

## 2021-03-11 MED ORDER — OXYCODONE-ACETAMINOPHEN 5-325 MG PO TABS: 1.0000 | ORAL_TABLET | Freq: Four times a day (QID) | ORAL | 0 refills | Status: DC | PRN

## 2021-03-11 MED ORDER — AMOXICILLIN-POT CLAVULANATE 875-125 MG PO TABS: 1.0000 | ORAL_TABLET | Freq: Two times a day (BID) | ORAL | 0 refills | Status: AC

## 2021-03-11 NOTE — Op Note (Signed)
   OPERATIVE REPORT Patient name: ROCHELL PUETT MRN: [id number] DOB: May 04, 1951  DOS: 03/09/2021  Preop Dx: Gangrene right fifth toe Postop Dx: same  Procedure:  1.  Amputation right fifth toe  Surgeon: Felecia Shelling DPM  Anesthesia: 50-50 mixture of 2% lidocaine plain with 0.5% Marcaine plain totaling 10 mL infiltrated in the patient's right lower extremity  Hemostasis: No tourniquet utilized  EBL: Minimal mL Materials: None Injectables: None Pathology: None  Condition: The patient tolerated the procedure and anesthesia well. No complications noted or reported   Justification for procedure: The patient is a 70 y.o. female PMHx diabetes mellitus PAD who presents today for surgical correction of gangrene of the right fifth toe. All conservative modalities of been unsuccessful in providing any sort of satisfactory alleviation of symptoms with the patient. The patient was told benefits as well as possible side effects of the surgery. The patient consented for surgical correction. The patient consent form was reviewed. All patient questions were answered. No guarantees were expressed or implied. The patient and the surgeon boson the patient consent form with the witness present and placed in the patient's chart.   Procedure in Detail: The patient was brought to the operating room, placed in the operating table in the supine position at which time an aseptic scrub and drape were performed about the patient's respective lower extremity after anesthesia was induced as described above. Attention was then directed to the surgical area where procedure number one commenced.  Procedure #1: Amputation right fifth toe A fishmouth type incision was planned and made overlying the MTP joint of the right fifth toe.  Incision was carried down to the level of joint capsule with tenotomy's of the extensor and flexor tendons.  Ligamentous structures around the fifth MTP were released using a surgical  15 scalpel.  The toe was distracted distally and removed in toto and sent for specimen.  Minimal bleeding was noted during the foot amputation procedure.  Any necrotic tissue within the amputation site was excisionally debrided away.  Primary closure was obtained using 4-0 Prolene suture.  Dry sterile compressive dressings were then applied to all previously mentioned incision sites about the patient's lower extremity.   The patient was then transferred from the operating room to the recovery room having tolerated the procedure and anesthesia well. All vital signs are stable. After a brief stay in the recovery room the patient was readmitted to inpatient room with adequate prescriptions for analgesia. Verbal as well as written instructions were provided for the patient regarding wound care. The patient is to keep the dressings clean dry and intact until they are to follow surgeon Dr. Gala Lewandowsky in the office upon discharge.   Surgical impression: Minimal bleeding noted during the amputation.  No tourniquet was utilized.  Patient remains high risk for more proximal amputation.  PAD is primarily responsible for the gangrene of the toe versus infection  Felecia Shelling, DPM [facility]  Dr. Felecia Shelling, DPM    [address] [place], [place] [address]                Office [phone]  Fax [phone]

## 2021-03-11 NOTE — Discharge Summary (Signed)
Physician Discharge Summary  Ashley Ellison GHW:[id number] DOB: 10-24-1950 DOA: 03/08/2021  PCP: [name], MD  Admit date: 03/08/2021 Discharge date: 03/11/2021  Admitted From: Home Disposition: Home  Recommendations for Outpatient Follow-up:  Follow up with PCP in 1-2 weeks Follow-up with podiatry 1 week Follow-up with vascular surgery 1 week  Home Health: No Equipment/Devices: None  Discharge Condition: Stable CODE STATUS: Full Diet recommendation: Regular  Brief/Interim Summary:  70 y.o. female with medical history significant of hypertension, hyperlipidemia, diabetes mellitus, PVD, who presents with right foot ulcer with infection.   Patient states that he has a chronic right foot diabetic foot ulcer in fifth toe.  He has been follow-up with the podiatrist. Pt was seen by her podiatrist a week ago and was told that she has surrounding infection of a chronic right fifth digit wound. She was started on Keflex yesterday, no improvement. Pt was sent to ED by her podiatrist, Dr. Lilian Kapur for IV antibiotics.  Patient has mild to moderate right foot pain, no fever or chills.   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  Tolerated procedure well.  Discussed case with podiatry.  Very minimal bleeding around the site.  Issue likely more vascular in nature.  Patient maintained in hospital to assess pain control.  Pain well controlled at time of discharge.  Patient will discharge home.  Follow-up outpatient podiatry 1 week.  Patient will need to follow-up with Dr. Wyn Quaker in the vascular surgery service as outpatient.  Will resume antiplatelet therapy at time of discharge.  Will need outpatient evaluation for consideration of angiography to assess vasculature   Discharge Diagnoses:  Principal Problem:   Right foot infection Active Problems:   Hypertension associated with diabetes (HCC)   AKI (acute kidney injury) (HCC)   Hyperlipidemia associated with type 2 diabetes  mellitus (HCC)   Diabetic foot ulcer (HCC)   Peripheral arterial disease (HCC)   Type 2 diabetes mellitus with other specified complication (HCC)   Toe infection  Right foot infection and diabetic foot ulcer in right 5th toe pt failed outpatient Keflex treatment.   Dr. 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  No physical therapy follow-up recommended.  Transition to oral antibiotics.  Pain control provided.  Follow-up outpatient podiatry and vascular surgery 1 week       Hypertension associated with diabetes (HCC) Resume home regimen   AKI (acute kidney injury) (HCC)  Mild, likely due to dehydration Recovered at time of discharge   Hyperlipidemia associated with type 2 diabetes mellitus (HCC) -Lipitor   Peripheral arterial disease (HCC) -Continue Lipitor -Restart antiplatelet agents.  Follow-up outpatient with vein and vascular services   Type 2 diabetes mellitus with other specified complication [facility]): Recent A1c 10.3, can resume home regimen.  Diabetes poorly controlled.  Recommend outpatient PCP follow-up  Discharge Instructions  Discharge Instructions     Diet - low sodium heart healthy   Complete by: As directed    Increase activity slowly   Complete by: As directed    No wound care   Complete by: As directed       Allergies as of 03/11/2021   No Known Allergies      Medication List     STOP taking these medications    cephALEXin 500 MG capsule Commonly known as: Keflex       TAKE these  medications    amoxicillin-clavulanate 875-125 MG tablet Commonly known as: AUGMENTIN Take 1 tablet by mouth every 12 (twelve) hours for 7 days.   atorvastatin 10 MG tablet Commonly known as: LIPITOR Take 1 tablet (10 mg total) by mouth daily.   clopidogrel 75 MG tablet Commonly known as:  PLAVIX Take 1 tablet (75 mg total) by mouth daily.   glipiZIDE 5 MG 24 hr tablet Commonly known as: GLUCOTROL XL Take 5 mg by mouth daily.   hydrALAZINE 25 MG tablet Commonly known as: APRESOLINE Take 1 tablet (25 mg total) by mouth every 8 (eight) hours.   oxyCODONE-acetaminophen 5-325 MG tablet Commonly known as: PERCOCET/ROXICET Take 1 tablet by mouth every 6 (six) hours as needed for moderate pain.        No Known Allergies  Consultations: Podiatry   Procedures/Studies: MR FOOT RIGHT WO CONTRAST  Result Date: 02/18/2021 CLINICAL DATA:  Foot swelling, diabetic, osteomyelitis suspected, xray done EXAM: MRI OF THE RIGHT FOREFOOT WITHOUT CONTRAST TECHNIQUE: Multiplanar, multisequence MR imaging of the right forefoot was performed. No intravenous contrast was administered. COMPARISON:  X-ray 02/17/2021 FINDINGS: Bones/Joint/Cartilage Bone marrow edema within the middle phalanx and distal phalanx of the right fifth toe without confluent low T1 bone marrow signal (series 10, image 5). No cortical erosion. Marrow signal of the remaining forefoot is within normal limits. No fracture. No dislocation. Mild osteoarthritis of the forefoot, most pronounced at the first MTP joint. Ligaments Intact Lisfranc ligament. Collateral ligaments of the forefoot appear intact. Muscles and Tendons Denervation changes of the intrinsic foot musculature. Intact flexor and extensor tendons. No tenosynovitis. Soft tissues Generalized soft tissue edema. No organized fluid collection. No discernible ulceration. IMPRESSION: 1. Bone marrow edema within the middle phalanx and distal phalanx of the right fifth toe without confluent low T1 bone marrow signal. 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 2. Generalized soft tissue edema. No organized fluid collection. 3. Mild osteoarthritis of the forefoot, most pronounced at the first MTP joint. Electronically  Signed   By: Duanne Guess D.O.   On: 02/18/2021 09:47   PERIPHERAL VASCULAR CATHETERIZATION  Result Date: 02/21/2021 See surgical note for result.  US ARTERIAL ABI (SCREENING LOWER EXTREMITY)  Result Date: 02/20/2021 CLINICAL DATA:  Black right fifth toe post blunt trauma. Hypertension, diabetes. EXAM: NONINVASIVE PHYSIOLOGIC VASCULAR STUDY OF BILATERAL LOWER EXTREMITIES TECHNIQUE: Evaluation of both lower extremities were performed at rest, including calculation of ankle-brachial indices with single level Doppler, pressure and pulse volume recording. COMPARISON:  None. FINDINGS: Right ABI:  Non calculable due to vascular noncompressibility Left ABI:  Non calculable due to vascular noncompressibility Right Lower Extremity:  Normal arterial waveforms at the ankle. Left Lower Extremity:  Normal arterial waveforms at the ankle. Attenuated waveform in the right fourth digit. IMPRESSION: Severely compromised study due to vascular noncompressibility, presumably related to medial calcifications in the setting of diabetes. Markedly attenuated arterial waveform in the right fourth digit. Electronically Signed   By: Corlis Leak M.D.   On: 02/20/2021 11:49   DG Foot Complete Right  Result Date: 03/08/2021 CLINICAL DATA:  Fifth toe infection EXAM: RIGHT FOOT COMPLETE - 3+ VIEW COMPARISON:  None. FINDINGS: Alignment is anatomic. No acute fracture. Joint spaces are preserved. No erosive changes or periosteal reaction identified. Extensive vascular calcification. IMPRESSION: No evidence of osteomyelitis by radiograph. Electronically Signed   By: Guadlupe Spanish M.D.   On: 03/08/2021 11:24   DG Foot Complete Right  Result Date:  02/17/2021 CLINICAL DATA:  Foot wound, swelling EXAM: RIGHT FOOT COMPLETE - 3+ VIEW COMPARISON:  None. FINDINGS: No acute bony abnormality. Specifically, no fracture, subluxation, or dislocation. No bone destruction to suggest osteomyelitis. Joint spaces maintained. Diffuse vascular  calcifications. IMPRESSION: No acute bony abnormality. Electronically Signed   By: Charlett Nose M.D.   On: 02/17/2021 18:40      Subjective: Patient seen and examined on day of discharge.  Stable no distress.  Pain well controlled.  Stable for discharge home.  Discharge Exam: Vitals:   03/11/21 0726 03/11/21 0742  BP: (!) 171/62 (!) 156/66  Pulse: 94   Resp: 12   Temp: 98.6 F (37 C)   SpO2: 99%    Vitals:   03/11/21 0508 03/11/21 0653 03/11/21 0726 03/11/21 0742  BP: (!) 181/55 (!) 156/64 (!) 171/62 (!) 156/66  Pulse: 95  94   Resp:   12   Temp:   98.6 F (37 C)   TempSrc:   Oral   SpO2: 99%  99%   Weight:      Height:        General: Pt is alert, awake, not in acute distress Cardiovascular: RRR, S1/S2 +, no rubs, no gallops Respiratory: CTA bilaterally, no wheezing, no rhonchi Abdominal: Soft, NT, ND, bowel sounds + Extremities: Status post right fifth toe amputation    The results of significant diagnostics from this hospitalization (including imaging, microbiology, ancillary and laboratory) are listed below for reference.     Microbiology: Recent Results (from the past 240 hour(s))  Blood culture (routine x 2)     Status: None (Preliminary result)   Collection Time: 03/08/21 11:40 AM   Specimen: BLOOD  Result Value Ref Range Status   Specimen Description BLOOD RAC  Final   Special Requests   Final    BOTTLES DRAWN AEROBIC AND ANAEROBIC Blood Culture adequate volume   Culture   Final    NO GROWTH 3 DAYS Performed at [facility], [address] [place], [place] [address]    Report Status PENDING  Incomplete  Blood culture (routine x 2)     Status: None (Preliminary result)   Collection Time: 03/08/21 11:41 AM   Specimen: BLOOD  Result Value Ref Range Status   Specimen Description BLOOD LAC  Final   Special Requests   Final    BOTTLES DRAWN AEROBIC AND ANAEROBIC Blood Culture adequate volume   Culture   Final    NO GROWTH 3 DAYS Performed  at [facility], [address] [place], [place] [address]    Report Status PENDING  Incomplete  Resp Panel by RT-PCR (Flu A&B, Covid) Nasopharyngeal Swab     Status: None   Collection Time: 03/08/21  5:16 PM   Specimen: Nasopharyngeal Swab; Nasopharyngeal(NP) swabs in vial transport medium  Result Value Ref Range Status   SARS Coronavirus 2 by RT PCR NEGATIVE NEGATIVE Final    Comment: (NOTE) SARS-CoV-2 target nucleic acids are NOT DETECTED.  The SARS-CoV-2 RNA is generally detectable in upper respiratory specimens during the acute phase of infection. The lowest concentration of SARS-CoV-2 viral copies this assay can detect is 138 copies/mL. A negative result does not preclude SARS-Cov-2 infection and should not be used as the sole basis for treatment or other patient management decisions. A negative result may occur with  improper specimen collection/handling, submission of specimen other than nasopharyngeal swab, presence of viral mutation(s) within the areas targeted by this assay, and inadequate number of viral  copies(<138 copies/mL). A negative result must be combined with clinical observations, patient history, and epidemiological information. The expected result is Negative.  Fact Sheet for Patients:  BloggerCourse.com  Fact Sheet for Healthcare Providers:  SeriousBroker.it  This test is no t yet approved or cleared by the [place] FDA and  has been authorized for detection and/or diagnosis of SARS-CoV-2 by FDA under an Emergency Use Authorization (EUA). This EUA will remain  in effect (meaning this test can be used) for the duration of the COVID-19 declaration under Section 564(b)(1) of the Act, 21 U.S.C.section 360bbb-3(b)(1), unless the authorization is terminated  or revoked sooner.       Influenza A by PCR NEGATIVE NEGATIVE Final   Influenza B by PCR NEGATIVE NEGATIVE Final    Comment: (NOTE) The  Xpert Xpress SARS-CoV-2/FLU/RSV plus assay is intended as an aid in the diagnosis of influenza from Nasopharyngeal swab specimens and should not be used as a sole basis for treatment. Nasal washings and aspirates are unacceptable for Xpert Xpress SARS-CoV-2/FLU/RSV testing.  Fact Sheet for Patients: BloggerCourse.com  Fact Sheet for Healthcare Providers: SeriousBroker.it  This test is not yet approved or cleared by the [place] FDA and has been authorized for detection and/or diagnosis of SARS-CoV-2 by FDA under an Emergency Use Authorization (EUA). This EUA will remain in effect (meaning this test can be used) for the duration of the COVID-19 declaration under Section 564(b)(1) of the Act, 21 U.S.C. section 360bbb-3(b)(1), unless the authorization is terminated or revoked.  Performed at [facility], [address] [place], [place] [address]   Aerobic/Anaerobic Culture w Gram Stain (surgical/deep wound)     Status: None (Preliminary result)   Collection Time: 03/09/21  7:25 PM   Specimen: PATH Other; Wound  Result Value Ref Range Status   Specimen Description   Final    WOUND RIGHT 5TH TOE CULTURE Performed at [facility], [address], [place], [place] [address]    Special Requests   Final    NONE Performed at [facility], [address] [place], [place] [address]    Gram Stain   Final    NO SQUAMOUS EPITHELIAL CELLS SEEN FEW WBC SEEN NO ORGANISMS SEEN Performed at [facility], [address], [place], [place] [address]    Culture PENDING  Incomplete   Report Status PENDING  Incomplete     Labs: BNP (last 3 results) No results for input(s): BNP in the last 8760 hours. Basic Metabolic Panel: Recent Labs  Lab 03/08/21 1028 03/09/21 0523 03/10/21 0418 03/11/21 0409  NA 139 140  --   --   K 4.0 3.3*  --   --   CL 102 106  --   --   CO2 26 25  --   --   GLUCOSE 211*  152*  --   --   BUN 16 13  --   --   CREATININE 1.13* 0.71 0.86 0.78  CALCIUM 9.8 9.2  --   --    Liver Function Tests: Recent Labs  Lab 03/08/21 1028  AST 17  ALT 13  ALKPHOS 44  BILITOT 0.8  PROT 8.7*  ALBUMIN 3.6   No results for input(s): LIPASE, AMYLASE in the last 168 hours. No results for input(s): AMMONIA in the last 168 hours. CBC: Recent Labs  Lab 03/08/21 1028 03/09/21 0523  WBC 8.2 6.9  NEUTROABS 5.1  --   HGB 12.3 10.8*  HCT 36.3 31.7*  MCV 92.4 92.2  PLT 377 310   Cardiac Enzymes: No results for input(s): CKTOTAL, CKMB, CKMBINDEX, TROPONINI in the last 168 hours. BNP: Invalid input(s): POCBNP CBG: Recent Labs  Lab 03/10/21 0709 03/10/21 1129 03/10/21 1639 03/10/21 2205 03/11/21 0728  GLUCAP 88 155* 144* 137* 121*   D-Dimer No results for input(s): DDIMER in the last 72 hours. Hgb A1c No results for input(s): HGBA1C in the last 72 hours. Lipid Profile No results for input(s): CHOL, HDL, LDLCALC, TRIG, CHOLHDL, LDLDIRECT in the last 72 hours. Thyroid function studies No results for input(s): TSH, T4TOTAL, T3FREE, THYROIDAB in the last 72 hours.  Invalid input(s): FREET3 Anemia work up No results for input(s): VITAMINB12, FOLATE, FERRITIN, TIBC, IRON, RETICCTPCT in the last 72 hours. Urinalysis    Component Value Date/Time   COLORURINE AMBER (A) 03/08/2021 1028   APPEARANCEUR CLOUDY (A) 03/08/2021 1028   LABSPEC 1.024 03/08/2021 1028   PHURINE 5.0 03/08/2021 1028   GLUCOSEU 50 (A) 03/08/2021 1028   HGBUR SMALL (A) 03/08/2021 1028   BILIRUBINUR NEGATIVE 03/08/2021 1028   KETONESUR NEGATIVE 03/08/2021 1028   PROTEINUR 100 (A) 03/08/2021 1028   NITRITE NEGATIVE 03/08/2021 1028   LEUKOCYTESUR SMALL (A) 03/08/2021 1028   Sepsis Labs Invalid input(s): PROCALCITONIN,  WBC,  LACTICIDVEN Microbiology Recent Results (from the past 240 hour(s))  Blood culture (routine x 2)     Status: None (Preliminary result)   Collection Time: 03/08/21  11:40 AM   Specimen: BLOOD  Result Value Ref Range Status   Specimen Description BLOOD RAC  Final   Special Requests   Final    BOTTLES DRAWN AEROBIC AND ANAEROBIC Blood Culture adequate volume   Culture   Final    NO GROWTH 3 DAYS Performed at [facility], [address] [place], [place] [address]    Report Status PENDING  Incomplete  Blood culture (routine x 2)     Status: None (Preliminary result)   Collection Time: 03/08/21 11:41 AM   Specimen: BLOOD  Result Value Ref Range Status   Specimen Description BLOOD LAC  Final   Special Requests   Final    BOTTLES DRAWN AEROBIC AND ANAEROBIC Blood Culture adequate volume   Culture   Final    NO GROWTH 3 DAYS Performed at [facility], [address] [place], [place] [address]    Report Status PENDING  Incomplete  Resp Panel by RT-PCR (Flu A&B, Covid) Nasopharyngeal Swab     Status: None   Collection Time: 03/08/21  5:16 PM   Specimen: Nasopharyngeal Swab; Nasopharyngeal(NP) swabs in vial transport medium  Result Value Ref Range Status   SARS Coronavirus 2 by RT PCR NEGATIVE NEGATIVE Final    Comment: (NOTE) SARS-CoV-2 target nucleic acids are NOT DETECTED.  The SARS-CoV-2 RNA is generally detectable in upper respiratory specimens during the acute phase of infection. The lowest concentration of SARS-CoV-2 viral copies this assay can detect is 138 copies/mL. A negative result does not preclude SARS-Cov-2 infection and should not be used as the sole basis for treatment or other patient management decisions. A negative result may occur with  improper specimen collection/handling, submission of specimen other than nasopharyngeal swab, presence of viral mutation(s) within the areas targeted by this assay, and inadequate number of viral copies(<138 copies/mL). A negative result must be combined with clinical observations, patient history, and epidemiological information. The expected result is Negative.  Fact  Sheet for Patients:  BloggerCourse.com  Fact Sheet for Healthcare Providers:  SeriousBroker.it  This test is no t yet approved or cleared by the [place] and  has been authorized for detection and/or diagnosis of SARS-CoV-2 by FDA under an Emergency Use Authorization (EUA). This EUA will remain  in effect (meaning this test can be used) for the duration of the COVID-19 declaration under Section 564(b)(1) of the Act, 21 U.S.C.section 360bbb-3(b)(1), unless the authorization is terminated  or revoked sooner.       Influenza A by PCR NEGATIVE NEGATIVE Final   Influenza B by PCR NEGATIVE NEGATIVE Final    Comment: (NOTE) The Xpert Xpress SARS-CoV-2/FLU/RSV plus assay is intended as an aid in the diagnosis of influenza from Nasopharyngeal swab specimens and should not be used as a sole basis for treatment. Nasal washings and aspirates are unacceptable for Xpert Xpress SARS-CoV-2/FLU/RSV testing.  Fact Sheet for Patients: BloggerCourse.com  Fact Sheet for Healthcare Providers: SeriousBroker.it  This test is not yet approved or cleared by the [place] FDA and has been authorized for detection and/or diagnosis of SARS-CoV-2 by FDA under an Emergency Use Authorization (EUA). This EUA will remain in effect (meaning this test can be used) for the duration of the COVID-19 declaration under Section 564(b)(1) of the Act, 21 U.S.C. section 360bbb-3(b)(1), unless the authorization is terminated or revoked.  Performed at [facility], [address] [place], [place] [address]   Aerobic/Anaerobic Culture w Gram Stain (surgical/deep wound)     Status: None (Preliminary result)   Collection Time: 03/09/21  7:25 PM   Specimen: PATH Other; Wound  Result Value Ref Range Status   Specimen Description   Final    WOUND RIGHT 5TH TOE CULTURE Performed at [facility], [address] [place], [place] [address]    Special Requests   Final    NONE Performed at [facility], [address] [place], [place] [address]    Gram Stain   Final    NO SQUAMOUS EPITHELIAL CELLS SEEN FEW WBC SEEN NO ORGANISMS SEEN Performed at [facility], [address] [place], [place] [address]    Culture PENDING  Incomplete   Report Status PENDING  Incomplete     Time coordinating discharge: Over 30 minutes  SIGNED:   Tresa Moore, MD  [facility] 03/11/2021, 1:48 PM Pager   If 7PM-7AM, please contact night-coverage

## 2021-03-12 NOTE — Addendum Note (Signed)
Addendum  created 03/12/21 1132 by Karoline Caldwell, CRNA   Intraprocedure Meds edited

## 2021-03-13 ENCOUNTER — Ambulatory Visit (INDEPENDENT_AMBULATORY_CARE_PROVIDER_SITE_OTHER): Payer: Medicare Other | Admitting: Podiatry

## 2021-03-13 ENCOUNTER — Other Ambulatory Visit: Payer: Self-pay

## 2021-03-13 DIAGNOSIS — Z9889 Other specified postprocedural states: Secondary | ICD-10-CM

## 2021-03-13 LAB — CULTURE, BLOOD (ROUTINE X 2)
Culture: NO GROWTH
Culture: NO GROWTH
Special Requests: ADEQUATE
Special Requests: ADEQUATE

## 2021-03-13 NOTE — Progress Notes (Signed)
   Subjective:  Patient presents today status post right fifth toe amputation. DOS: 03/09/2021.  Patient states that she feels well. She has kept the dressings clean and dry. She has been weight bearing in a surgical shoe. No new complaints at this time.   Past Medical History:  Diagnosis Date   Diabetes mellitus without complication (HCC)    Hypertension          Objective/Physical Exam Neurovascular status intact.  Skin incisions appear to be well coapted with sutures intact. No sign of infectious process noted. No dehiscence. No active bleeding noted. Unfortunately there is some ischemic discoloration to the dorsum of the foot.  The remaining digits are cool to touch.  There is concern with wound healing potential.  Fortunately there does not appear to be any acute infection complicating the foot at the moment.  Assessment: 1. s/p right fifth toe amputation. DOS: 03/09/2021 2.  PAD RLE   Plan of Care:  1. Patient was evaluated.  2.  For the moment we are going to observe to see if the foot is able to heal.  I did allow the patient to begin soaking the foot in warm water and Epson salt to help with vasodilation and improve circulation to the foot 3.  Cam boot dispensed.  Patient states that the surgical shoe is rubbing the side of her foot.  She is requesting a cam boot today. 4.  Return to clinic 1 week   Felecia Shelling, DPM [facility]  Dr. Felecia Shelling, DPM    [address] [place], [place] [address]                Office [phone]  Fax [phone]

## 2021-03-14 LAB — SURGICAL PATHOLOGY

## 2021-03-16 ENCOUNTER — Telehealth: Payer: Self-pay | Admitting: *Deleted

## 2021-03-16 NOTE — Telephone Encounter (Signed)
"  My mother had surgery by Dr. Logan Bores.  All her toes have turned black on the surgery foot.  She's at [facility] right now and they are asking for her Angioplasty images.  Can you send that to [facility] so they can review it because they do not want to do another one since she recently had that one, because of her Kidney disease?"  I can see it, it was done by Dr. Wyn Quaker.  Mr. Nehemiah Settle, D PA from [facility] got on the phone.  I asked him if they had Care Everywhere.  He stated they did but they can only see the report, they can not see the actual images, which is what they need.   I told him I was not aware of how to share the information.  Ms. Bloodworth got back on the phone and I asked her to contact Dr. Driscilla Grammes office.  I gave her the phone number of [phone].  I called Ms. Levinson back and informed her I would try to route the information.  Mr. Nehemiah Settle, asked me to send it to Dr. Effie Shy.  I told him I routed it but I'm not sure if it will go through with access to the images but I attempted to assist them.

## 2021-03-18 LAB — SUSCEPTIBILITY RESULT

## 2021-03-18 LAB — SUSCEPTIBILITY, AER + ANAEROB: Source of Sample: 8680

## 2021-03-20 ENCOUNTER — Ambulatory Visit: Payer: Medicare Other | Admitting: Podiatry

## 2021-03-21 LAB — AEROBIC/ANAEROBIC CULTURE W GRAM STAIN (SURGICAL/DEEP WOUND): Gram Stain: NONE SEEN

## 2021-08-13 ENCOUNTER — Ambulatory Visit (INDEPENDENT_AMBULATORY_CARE_PROVIDER_SITE_OTHER): Payer: BC Managed Care – PPO

## 2021-08-13 ENCOUNTER — Ambulatory Visit
Admission: EM | Admit: 2021-08-13 | Discharge: 2021-08-13 | Disposition: A | Discharge status: Home / Self Care | Payer: BC Managed Care – PPO | Attending: Internal Medicine | Admitting: Internal Medicine

## 2021-08-13 ENCOUNTER — Other Ambulatory Visit: Payer: Self-pay

## 2021-08-13 ENCOUNTER — Encounter: Payer: Self-pay | Admitting: Emergency Medicine

## 2021-08-13 DIAGNOSIS — M79621 Pain in right upper arm: Secondary | ICD-10-CM

## 2021-08-13 DIAGNOSIS — S46911A Strain of unspecified muscle, fascia and tendon at shoulder and upper arm level, right arm, initial encounter: Secondary | ICD-10-CM

## 2021-08-13 DIAGNOSIS — W19XXXA Unspecified fall, initial encounter: Secondary | ICD-10-CM | POA: Diagnosis not present

## 2021-08-13 DIAGNOSIS — M25521 Pain in right elbow: Secondary | ICD-10-CM | POA: Diagnosis not present

## 2021-08-13 NOTE — ED Provider Notes (Signed)
?[facility] ? ? ? ?CSN: [id number] ?Arrival date & time: 08/13/21  1102 ? ? ?  ? ?History   ?Chief Complaint ?Chief Complaint  ?Patient presents with  ? Fall  ? Arm Injury  ?  right  ? ? ?HPI ?Ashley Ellison is a 71 y.o. female who presents with R upper arm and elbow pain who has R leg amputation. States she leaned forward to get something and fell worward and her R inner arm hit her wheelchair arm rest and since then she has pain on inner humerus and lateral elbow with supination. This is her strong arm to get up off the chair. She denies hitting her head, or pain on neck on back  ? ? ? ?Past Medical History:  ?Diagnosis Date  ? Diabetes mellitus without complication (HCC)   ? Hypertension   ? ? ?Patient Active Problem List  ? Diagnosis Date Noted  ? Toe infection 03/09/2021  ? Right foot infection 03/08/2021  ? Type 2 diabetes mellitus with other specified complication (HCC) 03/08/2021  ? Peripheral arterial disease (HCC) 02/21/2021  ? Gangrene of toe of right foot (HCC)   ? Diabetic foot ulcers (HCC) 02/17/2021  ? AKI (acute kidney injury) (HCC) 02/17/2021  ? Diabetic foot ulcer (HCC) 02/17/2021  ? E coli bacteremia 03/19/2019  ? Diabetes mellitus without complication (HCC)   ? Hypertension associated with diabetes (HCC)   ? Diabetic polyneuropathy associated with type 2 diabetes mellitus (HCC) 03/01/2018  ? Hyperlipidemia associated with type 2 diabetes mellitus (HCC) 11/20/2013  ? Osteoporosis 11/20/2013  ? ? ?Past Surgical History:  ?Procedure Laterality Date  ? AMPUTATION TOE Right 03/09/2021  ? Procedure: AMPUTATION TOE-5th Toe;  Surgeon: Felecia ShellingEvans, Brent M, DPM;  Location: [facility];  Service: Podiatry;  Laterality: Right;  ? BACK SURGERY    ? LOWER EXTREMITY ANGIOGRAPHY Right 02/21/2021  ? Procedure: Lower Extremity Angiography;  Surgeon: Annice Needyew, Jason S, MD;  Location: [facility];  Service: Cardiovascular;  Laterality: Right;  ? VAGINAL DELIVERY    ? ? ?OB History   ?No obstetric history  on file. ?  ? ? ? ?Home Medications   ? ?Prior to Admission medications   ?Medication Sig Start Date End Date Taking? Authorizing Provider  ?atorvastatin (LIPITOR) 10 MG tablet Take 1 tablet (10 mg total) by mouth daily. 02/23/21  Yes Marrion CoyZhang, Dekui, MD  ?clopidogrel (PLAVIX) 75 MG tablet Take 1 tablet (75 mg total) by mouth daily. 02/23/21  Yes Marrion CoyZhang, Dekui, MD  ?hydrALAZINE (APRESOLINE) 25 MG tablet Take 1 tablet (25 mg total) by mouth every 8 (eight) hours. 02/22/21  Yes Marrion CoyZhang, Dekui, MD  ?glipiZIDE (GLUCOTROL XL) 5 MG 24 hr tablet Take 5 mg by mouth daily. 03/01/21   [provider]  ? ? ?Family History ?Family History  ?Problem Relation Age of Onset  ? Liver disease Mother   ? ? ?Social History ?Social History  ? ?Tobacco Use  ? Smoking status: Never  ? Smokeless tobacco: Never  ?Vaping Use  ? Vaping Use: Never used  ?Substance Use Topics  ? Alcohol use: No  ? Drug use: Not Currently  ? ? ? ?Allergies   ?Patient has no known allergies. ? ? ?Review of Systems ?Review of Systems  ?Musculoskeletal:  Positive for arthralgias and gait problem. Negative for joint swelling.  ?Skin:  Negative for color change, rash and wound.  ?Neurological:  Negative for syncope, weakness and numbness.  ? ? ?Physical Exam ?Triage Vital Signs ?ED  Triage Vitals  ?Enc Vitals Group  ?   BP 08/13/21 1135 (!) 185/80  ?   Pulse Rate 08/13/21 1135 84  ?   Resp 08/13/21 1135 18  ?   Temp 08/13/21 1135 98.9 ?F (37.2 ?C)  ?   Temp Source 08/13/21 1135 Oral  ?   SpO2 08/13/21 1135 97 %  ?   Weight 08/13/21 1133 179 lb 14.3 oz (81.6 kg)  ?   Height 08/13/21 1133 5\' 7"  (1.702 m)  ?   Head Circumference --   ?   Peak Flow --   ?   Pain Score 08/13/21 1133 5  ?   Pain Loc --   ?   Pain Edu? --   ?   Excl. in GC? --   ? ?No data found. ? ?Updated Vital Signs ?BP (!) 185/80 (BP Location: Left Arm)   Pulse 84   Temp 98.9 ?F (37.2 ?C) (Oral)   Resp 18   Ht 5\' 7"  (1.702 m)   Wt 179 lb 14.3 oz (81.6 kg)   SpO2 97%   BMI 28.18 kg/m?  ? ?Visual  Acuity ?Right Eye Distance:   ?Left Eye Distance:   ?Bilateral Distance:   ? ?Right Eye Near:   ?Left Eye Near:    ?Bilateral Near:    ? ?Physical Exam ?Vitals and nursing note reviewed.  ?Constitutional:   ?   General: She is not in acute distress. ?HENT:  ?   Right Ear: External ear normal.  ?   Left Ear: External ear normal.  ?Eyes:  ?   General: No scleral icterus. ?   Conjunctiva/sclera: Conjunctivae normal.  ?Musculoskeletal:  ?   Cervical back: Neck supple.  ?   Comments: R SHOULDER- non tender, but seems to have a frozen shoulder, ?R HUMERUS- no swelling or bruising, has local tenderness on inner upper area, and mid shaft when she raises her arm up, which causes a lot of pain ?R ELBOW- no swelling or bruising noted. Has normal ROM, but pain provoked on lateral epicondyle with supination  ?Skin: ?   General: Skin is warm and dry.  ?   Findings: No bruising, erythema or rash.  ?Neurological:  ?   Mental Status: She is alert and oriented to person, place, and time.  ?   Gait: Gait abnormal.  ?Psychiatric:     ?   Mood and Affect: Mood normal.     ?   Behavior: Behavior normal.     ?   Thought Content: Thought content normal.     ?   Judgment: Judgment normal.  ? ? ? ?UC Treatments / Results  ?Labs ?(all labs ordered are listed, but only abnormal results are displayed) ?Labs Reviewed - No data to display ? ?EKG ? ? ?Radiology ?DG Elbow Complete Right ? ?Result Date: 08/13/2021 ?CLINICAL DATA:  Right elbow pain after fall. EXAM: RIGHT ELBOW - COMPLETE 3+ VIEW COMPARISON:  None. FINDINGS: There is no evidence of fracture, dislocation, or joint effusion. There is no evidence of arthropathy or other focal bone abnormality. Soft tissues are unremarkable. IMPRESSION: Negative. Electronically Signed   By: M.D.   On: 08/13/2021 12:18  ? ?DG Humerus Right ? ?Result Date: 08/13/2021 ?CLINICAL DATA:  Right arm pain after fall. EXAM: RIGHT HUMERUS - 2+ VIEW COMPARISON:  None. FINDINGS: There is no evidence of  fracture or other focal bone lesions. Soft tissues are unremarkable. IMPRESSION: Negative. Electronically Signed  By: Lupita Raider M.D.   On: 08/13/2021 12:19   ? ?Procedures ?Procedures (including critical care time) ? ?Medications Ordered in UC ?Medications - No data to display ? ?Initial Impression / Assessment and Plan / UC Course  ?I have reviewed the triage vital signs and the nursing notes. ?Pertinent imaging results that were available during my care of the patient were reviewed by me and considered in my medical decision making (see chart for details). ?R arm strain ?See instructions.  ? ? ? ?Final Clinical Impressions(s) / UC Diagnoses  ? ?Final diagnoses:  ?Strain of right upper arm, initial encounter  ? ? ? ?Discharge Instructions   ? ?  ?Apply ice on areas of pain for 15 minutes 2-3 times a day for 2 days, then alternate with heat. ?Follow up with your family doctor next week.  ?You may take Tylenol 500 mg every 6 hours for pain.  ? ? ? ? ?ED Prescriptions   ?None ?  ? ?I have reviewed the PDMP during this encounter. ?  ?Garey Ham, PA-C ?08/13/21 1352 ? ?

## 2021-08-13 NOTE — ED Triage Notes (Signed)
Pt states she fell out of her wheelchair yesterday at home she is c/o right upper arm pain since. No bruising or visual swelling.  ?

## 2021-08-13 NOTE — Discharge Instructions (Signed)
Apply ice on areas of pain for 15 minutes 2-3 times a day for 2 days, then alternate with heat. ?Follow up with your family doctor next week.  ?You may take Tylenol 500 mg every 6 hours for pain.  ?

## 2021-11-13 ENCOUNTER — Ambulatory Visit
Admission: EM | Admit: 2021-11-13 | Discharge: 2021-11-13 | Disposition: A | Discharge status: Home / Self Care | Payer: Medicare Other

## 2021-11-13 ENCOUNTER — Ambulatory Visit (INDEPENDENT_AMBULATORY_CARE_PROVIDER_SITE_OTHER): Payer: Medicare Other

## 2021-11-13 DIAGNOSIS — R059 Cough, unspecified: Secondary | ICD-10-CM

## 2021-11-13 DIAGNOSIS — R0789 Other chest pain: Secondary | ICD-10-CM

## 2021-11-13 DIAGNOSIS — R062 Wheezing: Secondary | ICD-10-CM | POA: Diagnosis not present

## 2021-11-13 MED ORDER — FLUTICASONE-SALMETEROL 100-50 MCG/ACT IN AEPB: 1.0000 | INHALATION_SPRAY | Freq: Two times a day (BID) | RESPIRATORY_TRACT | 0 refills | Status: AC

## 2021-11-13 MED ORDER — BENZONATATE 100 MG PO CAPS: 100.0000 mg | ORAL_CAPSULE | Freq: Three times a day (TID) | ORAL | 0 refills | Status: AC | PRN

## 2021-11-13 NOTE — ED Triage Notes (Signed)
Pt reports L sided chest wall pain x 2 weeks.  Intermittent with no radiating sites or associated s/s.  Also has had cough x 1 mo.  Pain is reproducible with touch.

## 2021-11-13 NOTE — ED Provider Notes (Signed)
[facility]    CSN: [id number] Arrival date & time: 11/13/21  1549      History   Chief Complaint Chief Complaint  Patient presents with   Chest Pain   Cough    Improving     HPI Ashley Ellison is a 71 y.o. female.   HPI  Ashley Ellison is in today for cough. She reports that this has been going on for some time. She does have some phlegm with no color. The cough is worse at night.  She has been using Vicks and Mucinex with minimal relief. Denies fever, chills, headache, dizziness, visual changes, shortness of breath, dyspnea on exertion, chest pain, nausea, vomiting, polyuria, constipation, diarrhea, or any edema.   She denies any other new concerns that need to be discussed.   Past Medical History:  Diagnosis Date   Diabetes mellitus without complication (HCC)    Hypertension     Patient Active Problem List   Diagnosis Date Noted   Toe infection 03/09/2021   Right foot infection 03/08/2021   Type 2 diabetes mellitus with other specified complication (HCC) 03/08/2021   Peripheral arterial disease (HCC) 02/21/2021   Gangrene of toe of right foot (HCC)    Diabetic foot ulcers (HCC) 02/17/2021   AKI (acute kidney injury) (HCC) 02/17/2021   Diabetic foot ulcer (HCC) 02/17/2021   E coli bacteremia 03/19/2019   Diabetes mellitus without complication (HCC)    Hypertension associated with diabetes (HCC)    Diabetic polyneuropathy associated with type 2 diabetes mellitus (HCC) 03/01/2018   Hyperlipidemia associated with type 2 diabetes mellitus (HCC) 11/20/2013   Osteoporosis 11/20/2013    Past Surgical History:  Procedure Laterality Date   AMPUTATION TOE Right 03/09/2021   Procedure: AMPUTATION TOE-5th Toe;  Surgeon: Felecia Shelling, DPM;  Location: [facility];  Service: Podiatry;  Laterality: Right;   BACK SURGERY     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      Home Medications    Prior to Admission medications   Medication Sig Start Date End Date Taking? Authorizing Provider  atorvastatin (LIPITOR) 10 MG tablet Take 1 tablet (10 mg total) by mouth daily. 02/23/21  Yes Marrion Coy, MD  benzonatate (TESSALON) 100 MG capsule Take 1 capsule (100 mg total) by mouth 3 (three) times daily as needed for up to 10 days for cough. Never suck or chew on a benzonatate capsule. 11/13/21 11/23/21 Yes Barbette Merino, NP  clopidogrel (PLAVIX) 75 MG tablet Take 1 tablet (75 mg total) by mouth daily. 02/23/21  Yes Marrion Coy, MD  fluticasone-salmeterol [facility] INHUB) 100-50 MCG/ACT AEPB Inhale 1 puff into the lungs 2 (two) times daily for 7 days. 11/13/21 11/20/21 Yes Tyah Acord, Shana Chute, NP  gabapentin (NEURONTIN) 100 MG capsule Take 100 mg by mouth 3 (three) times daily.   Yes [provider]  glipiZIDE (GLUCOTROL XL) 5 MG 24 hr tablet Take 5 mg by mouth daily. 03/01/21  Yes [provider]  hydrALAZINE (APRESOLINE) 25 MG tablet Take 1 tablet (25 mg total) by mouth every 8 (eight) hours. 02/22/21  Yes Marrion Coy, MD    Family History Family History  Problem Relation Age of Onset  Liver disease Mother     Social History Social History   Tobacco Use   Smoking status: Never   Smokeless tobacco: Never  Vaping Use   Vaping Use: Never used  Substance Use Topics   Alcohol use: No   Drug use: Not Currently     Allergies   Patient has no known allergies.   Review of Systems Review of Systems   Physical Exam Triage Vital Signs ED Triage Vitals  Enc Vitals Group     BP      Pulse      Resp      Temp      Temp src      SpO2      Weight      Height      Head Circumference      Peak Flow      Pain Score      Pain Loc      Pain Edu?      Excl. in GC?     No data found.  Updated Vital Signs BP (!) 139/57 (BP Location: Left Arm)   Pulse 95   Temp 99.2 F (37.3 C) (Oral)   Resp 20   SpO2 99%   Visual Acuity Right Eye Distance:   Left Eye Distance:   Bilateral Distance:    Right Eye Near:   Left Eye Near:    Bilateral Near:     Physical Exam Constitutional:      General: She is not in acute distress.    Appearance: She is not ill-appearing, toxic-appearing or diaphoretic.  HENT:     Head: Normocephalic.  Cardiovascular:     Rate and Rhythm: Normal rate and regular rhythm.     Heart sounds: Normal heart sounds.  Pulmonary:     Effort: Pulmonary effort is normal.     Breath sounds: Decreased breath sounds present. No wheezing, rhonchi or rales.  Musculoskeletal:     Cervical back: Normal range of motion.     Right lower leg: Right lower leg edema: trace.     Left lower leg: Edema present.     Comments: R BKA  Skin:    General: Skin is warm and dry.  Neurological:     Mental Status: She is alert and oriented to person, place, and time.  Psychiatric:        Mood and Affect: Mood normal.        Behavior: Behavior normal.      UC Treatments / Results  Labs (all labs ordered are listed, but only abnormal results are displayed) Labs Reviewed - No data to display  EKG   Radiology DG Chest 2 View  Result Date: 11/13/2021 CLINICAL DATA:  Provided history: Cough and chest pressure. Additional history provided: Left-sided chest wall pain. EXAM: CHEST - 2 VIEW COMPARISON:  Chest radiograph 03/18/2019 and earlier. CT angiogram chest 03/18/2019. FINDINGS: Heart size within normal limits. No appreciable airspace consolidation or pulmonary edema. Mild linear atelectasis within right mid lung. No evidence of pleural effusion or pneumothorax. No acute bony abnormality identified. Degenerative changes of the spine. IMPRESSION: Mild linear atelectasis within right mid lung. No appreciable airspace consolidation or pulmonary edema.  Electronically Signed   By: Jackey Loge D.O.   On: 11/13/2021 17:15    Procedures Procedures (including critical care time)  Medications Ordered in UC Medications - No data to display  Initial Impression / Assessment and Plan / UC Course  I have reviewed the  triage vital signs and the nursing notes.  Pertinent labs & imaging results that were available during my care of the patient were reviewed by me and considered in my medical decision making (see chart for details).    Cough Final Clinical Impressions(s) / UC Diagnoses   Final diagnoses:  Cough, unspecified type  Wheezing     Discharge Instructions      Your chest xray show no evidence of pleural effusion or pneumothorax. We have prescribed you an inhaler to assist you with moments of wheezing.  We have also prescribed you a Benzonatate 100 mg Three times a day as needed for the cough. Your cough can be soothed with a cough drops.   Please follow up with your PCP as scheduled.        ED Prescriptions     Medication Sig Dispense Auth. Provider   fluticasone-salmeterol (WIXELA INHUB) 100-50 MCG/ACT AEPB Inhale 1 puff into the lungs 2 (two) times daily for 7 days. 14 each Barbette Merino, NP   benzonatate (TESSALON) 100 MG capsule Take 1 capsule (100 mg total) by mouth 3 (three) times daily as needed for up to 10 days for cough. Never suck or chew on a benzonatate capsule. 30 capsule Barbette Merino, NP      PDMP not reviewed this encounter.   Thad Ranger [place], [place] 11/13/21 1801

## 2021-11-13 NOTE — Discharge Instructions (Addendum)
Your chest xray show no evidence of pleural effusion or pneumothorax. We have prescribed you an inhaler to assist you with moments of wheezing.  We have also prescribed you a Benzonatate 100 mg Three times a day as needed for the cough. Your cough can be soothed with a cough drops.   Please follow up with your PCP as scheduled.

## 2022-03-04 ENCOUNTER — Encounter (INDEPENDENT_AMBULATORY_CARE_PROVIDER_SITE_OTHER): Payer: Self-pay

## 2022-05-11 ENCOUNTER — Encounter: Payer: Self-pay | Admitting: Emergency Medicine

## 2022-05-11 ENCOUNTER — Ambulatory Visit
Admission: EM | Admit: 2022-05-11 | Discharge: 2022-05-11 | Disposition: A | Discharge status: Home / Self Care | Payer: Medicare Other | Attending: Physician Assistant | Admitting: Physician Assistant

## 2022-05-11 DIAGNOSIS — R062 Wheezing: Secondary | ICD-10-CM

## 2022-05-11 DIAGNOSIS — K0889 Other specified disorders of teeth and supporting structures: Secondary | ICD-10-CM | POA: Diagnosis not present

## 2022-05-11 DIAGNOSIS — K047 Periapical abscess without sinus: Secondary | ICD-10-CM | POA: Diagnosis not present

## 2022-05-11 MED ORDER — ALBUTEROL SULFATE HFA 108 (90 BASE) MCG/ACT IN AERS: 1.0000 | INHALATION_SPRAY | Freq: Four times a day (QID) | RESPIRATORY_TRACT | 0 refills | Status: DC | PRN

## 2022-05-11 MED ORDER — AMOXICILLIN-POT CLAVULANATE 875-125 MG PO TABS
1.0000 | ORAL_TABLET | Freq: Two times a day (BID) | ORAL | 0 refills | Status: AC
Start: 2022-05-11 — End: 2022-05-18

## 2022-05-11 MED ORDER — TRAMADOL HCL 50 MG PO TABS: 50.0000 mg | ORAL_TABLET | Freq: Four times a day (QID) | ORAL | 0 refills | Status: AC | PRN

## 2022-05-11 NOTE — ED Provider Notes (Signed)
[facility]    CSN: [id number] Arrival date & time: 05/11/22  1121      History   Chief Complaint Chief Complaint  Patient presents with   Dental Pain    HPI Ashley Ellison is a 72 y.o. female with complaints of right lower jaw and tooth pain for the past 2 days.  She says that she broke a tooth of the lower side about 4 days ago.  She reports a lot of swelling in her jaw and cheek.  Has been taking Motrin and Tylenol over-the-counter without relief.  She does plan to follow-up with her dentist but thinks she may have an infection.  She says that she think she has an abscess.  She also reports wheezing off-and-on for the past several months.  She denies any history of asthma.  Says she has been prescribed an inhaler in the past and it takes the wheezing away.  Not reporting any swelling in lower extremities at this time, chest pain, weakness or significant fatigue.  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  HPI  Past Medical History:  Diagnosis Date   Diabetes mellitus without complication (HCC)    Hypertension     Patient Active Problem List   Diagnosis Date Noted   Toe infection 03/09/2021   Right foot infection 03/08/2021   Type 2 diabetes mellitus with other specified complication (HCC) 03/08/2021   Peripheral arterial disease (HCC) 02/21/2021   Gangrene of toe of right foot (HCC)    Diabetic foot ulcers (HCC) 02/17/2021   AKI (acute kidney injury) (HCC) 02/17/2021   Diabetic foot ulcer (HCC) 02/17/2021   E coli bacteremia 03/19/2019   Diabetes mellitus without complication (HCC)    Hypertension associated with diabetes (HCC)    Diabetic polyneuropathy associated with type 2 diabetes mellitus (HCC) 03/01/2018   Hyperlipidemia associated with type 2 diabetes mellitus (HCC) 11/20/2013   Osteoporosis 11/20/2013    Past Surgical History:  Procedure Laterality Date    AMPUTATION TOE Right 03/09/2021   Procedure: AMPUTATION TOE-5th Toe;  Surgeon: Felecia Shelling, DPM;  Location: [facility] ORS;  Service: Podiatry;  Laterality: Right;   BACK SURGERY     LEG AMPUTATION BELOW KNEE Right 03/2021   LOWER EXTREMITY ANGIOGRAPHY Right 02/21/2021   Procedure: Lower Extremity Angiography;  Surgeon: Annice Needy, MD;  Location: [facility] INVASIVE CV LAB;  Service: Cardiovascular;  Laterality: Right;   VAGINAL DELIVERY      OB History   No obstetric history on file.      Home Medications    Prior to Admission medications   Medication Sig Start Date End Date Taking? Authorizing Provider  albuterol (VENTOLIN HFA) 108 (90 Base) MCG/ACT inhaler Inhale 1-2 puffs into the lungs every 6 (six) hours as needed for wheezing or shortness of breath. 05/11/22  Yes Eusebio Friendly B, PA-C  amoxicillin-clavulanate (AUGMENTIN) 875-125 MG tablet Take 1 tablet by mouth every 12 (twelve) hours for 7 days. 05/11/22 05/18/22 Yes Eusebio Friendly B, PA-C  atorvastatin (LIPITOR) 10 MG tablet Take 1 tablet (10 mg total) by mouth daily. 02/23/21   Marrion Coy, MD  clopidogrel (PLAVIX) 75 MG tablet Take 1 tablet (75 mg total) by mouth daily. 02/23/21   Marrion Coy, MD  fluticasone-salmeterol [facility] INHUB) 100-50 MCG/ACT AEPB Inhale 1 puff into the lungs 2 (two) times daily for 7 days. 11/13/21 11/20/21  Barbette Merino, NP  gabapentin (NEURONTIN)  100 MG capsule Take 100 mg by mouth 3 (three) times daily.    [provider]  glipiZIDE (GLUCOTROL XL) 5 MG 24 hr tablet Take 5 mg by mouth daily. 03/01/21   [provider]  hydrALAZINE (APRESOLINE) 25 MG tablet Take 1 tablet (25 mg total) by mouth every 8 (eight) hours. 02/22/21   Sharen Hones, MD  traMADol (ULTRAM) 50 MG tablet Take 1 tablet (50 mg total) by mouth every 6 (six) hours as needed for up to 3 days. 05/11/22 05/14/22 Yes Danton Clap, PA-C    Family History Family History  Problem Relation Age of Onset   Liver disease Mother      Social History Social History   Tobacco Use   Smoking status: Never   Smokeless tobacco: Never  Vaping Use   Vaping Use: Never used  Substance Use Topics   Alcohol use: No   Drug use: Not Currently     Allergies   Patient has no known allergies.   Review of Systems Review of Systems  Constitutional:  Negative for fatigue and fever.  HENT:  Positive for dental problem and facial swelling.   Respiratory:  Positive for cough and wheezing. Negative for shortness of breath.   Cardiovascular:  Negative for chest pain and leg swelling.  Gastrointestinal:  Negative for vomiting.  Neurological:  Negative for dizziness, weakness and headaches.  Hematological:  Negative for adenopathy.     Physical Exam Triage Vital Signs ED Triage Vitals  Enc Vitals Group     BP 05/11/22 1145 (!) 152/73     Pulse Rate 05/11/22 1145 87     Resp 05/11/22 1145 14     Temp 05/11/22 1145 99.4 F (37.4 C)     Temp Source 05/11/22 1145 Oral     SpO2 05/11/22 1145 96 %     Weight 05/11/22 1144 179 lb 14.3 oz (81.6 kg)     Height 05/11/22 1144 5\' 7"  (1.702 m)     Head Circumference --      Peak Flow --      Pain Score 05/11/22 1143 10     Pain Loc --      Pain Edu? --      Excl. in [place]? --    No data found.  Updated Vital Signs BP (!) 152/73 (BP Location: Left Arm)   Pulse 87   Temp 99.4 F (37.4 C) (Oral)   Resp 14   Ht 5\' 7"  (1.702 m)   Wt 179 lb 14.3 oz (81.6 kg)   SpO2 96%   BMI 28.18 kg/m   Physical Exam Vitals and nursing note reviewed.  Constitutional:      General: She is not in acute distress.    Appearance: Normal appearance. She is not ill-appearing or toxic-appearing.  HENT:     Head: Normocephalic and atraumatic.     Nose: Nose normal.     Mouth/Throat:     Mouth: Mucous membranes are moist.     Dentition: Abnormal dentition.     Pharynx: Oropharynx is clear.     Comments: Majority of teeth are missing from the right lower region throughout.  On the right  lower side she has a tooth that is significantly broken with surrounding erythema and swelling.  She also has some swelling of the right jaw.  Area is all tender to palpation. Eyes:     General: No scleral icterus.       Right eye: No discharge.  Left eye: No discharge.     Conjunctiva/sclera: Conjunctivae normal.  Cardiovascular:     Rate and Rhythm: Normal rate and regular rhythm.     Heart sounds: Normal heart sounds.  Pulmonary:     Effort: Pulmonary effort is normal. No respiratory distress.     Breath sounds: Wheezing (few scattered wheezes throughout) present.  Musculoskeletal:     Cervical back: Neck supple.     Right lower leg: No edema.     Left lower leg: No edema.  Skin:    General: Skin is dry.  Neurological:     General: No focal deficit present.     Mental Status: She is alert. Mental status is at baseline.     Motor: No weakness.     Comments: Wheelchair bound. Right BKA  Psychiatric:        Mood and Affect: Mood normal.        Behavior: Behavior normal.        Thought Content: Thought content normal.      UC Treatments / Results  Labs (all labs ordered are listed, but only abnormal results are displayed) Labs Reviewed - No data to display  EKG   Radiology No results found.  Procedures Procedures (including critical care time)  Medications Ordered in UC Medications - No data to display  Initial Impression / Assessment and Plan / UC Course  I have reviewed the triage vital signs and the nursing notes.  Pertinent labs & imaging results that were available during my care of the patient were reviewed by me and considered in my medical decision making (see chart for details).   72 year old female presents for concerns about dental abscess.  Reports a broken tooth 4 days ago and pain and swelling for the past 2 days.  No fever.  Taking OTC medications for pain without relief.  Also reports that she would like a refill of an inhaler for her wheezing  that has been going on for several months.  On exam she does have evidence of dental infection and likely dental abscess forming.  Sent Augmentin to pharmacy as well as Ultram after reviewing controlled substance database.  Also reviewed supportive care, cryotherapy, Orajel and follow-up with dentist as soon as possible.  ED for any fever or worsening symptoms.  I prescribed patient albuterol inhaler for her wheezing and advised her to follow-up with PCP especially if that inhaler is not helping.  Go to ED for any acute worsening of breathing.   Final Clinical Impressions(s) / UC Diagnoses   Final diagnoses:  Dental abscess  Pain, dental  Wheezing     Discharge Instructions      -I sent antibiotics for your dental infection something for pain.  He can also apply Orajel and use ice. - I have sent an inhaler to help with your wheezing.  If this is not helping enough, follow-up with your primary care provider. - If the wheezing and breathing acutely worsen or you develop fever, go to ER.     ED Prescriptions     Medication Sig Dispense Auth. Provider   amoxicillin-clavulanate (AUGMENTIN) 875-125 MG tablet Take 1 tablet by mouth every 12 (twelve) hours for 7 days. 14 tablet Laurene Footman B, PA-C   traMADol (ULTRAM) 50 MG tablet Take 1 tablet (50 mg total) by mouth every 6 (six) hours as needed for up to 3 days. 10 tablet Laurene Footman B, PA-C   albuterol (VENTOLIN HFA) 108 (90 Base) MCG/ACT inhaler Inhale  1-2 puffs into the lungs every 6 (six) hours as needed for wheezing or shortness of breath. 1 g Danton Clap, PA-C      I have reviewed the PDMP during this encounter.   Danton Clap, PA-C 05/11/22 1236

## 2022-05-11 NOTE — ED Triage Notes (Signed)
Patient states that she broke her right bottom tooth about 2 days ago.  Patient reports pain and swelling on the right side of her face.

## 2022-05-11 NOTE — Discharge Instructions (Signed)
-  I sent antibiotics for your dental infection something for pain.  He can also apply Orajel and use ice. - I have sent an inhaler to help with your wheezing.  If this is not helping enough, follow-up with your primary care provider. - If the wheezing and breathing acutely worsen or you develop fever, go to ER.

## 2022-06-13 ENCOUNTER — Other Ambulatory Visit: Payer: Self-pay | Admitting: Internal Medicine

## 2022-06-13 DIAGNOSIS — N289 Disorder of kidney and ureter, unspecified: Secondary | ICD-10-CM

## 2022-06-13 DIAGNOSIS — E1121 Type 2 diabetes mellitus with diabetic nephropathy: Secondary | ICD-10-CM

## 2022-06-13 DIAGNOSIS — E0822 Diabetes mellitus due to underlying condition with diabetic chronic kidney disease: Secondary | ICD-10-CM

## 2022-06-19 ENCOUNTER — Ambulatory Visit
Admission: RE | Admit: 2022-06-19 | Discharge: 2022-06-19 | Disposition: A | Discharge status: Home / Self Care | Payer: Medicare Other | Source: Ambulatory Visit | Attending: Internal Medicine | Admitting: Internal Medicine

## 2022-06-19 DIAGNOSIS — N1832 Chronic kidney disease, stage 3b: Secondary | ICD-10-CM | POA: Insufficient documentation

## 2022-06-19 DIAGNOSIS — E1121 Type 2 diabetes mellitus with diabetic nephropathy: Secondary | ICD-10-CM | POA: Insufficient documentation

## 2022-06-19 DIAGNOSIS — E0822 Diabetes mellitus due to underlying condition with diabetic chronic kidney disease: Secondary | ICD-10-CM | POA: Diagnosis present

## 2022-06-19 DIAGNOSIS — N289 Disorder of kidney and ureter, unspecified: Secondary | ICD-10-CM | POA: Diagnosis present

## 2022-07-18 ENCOUNTER — Other Ambulatory Visit: Payer: Self-pay | Admitting: Nephrology

## 2022-07-18 DIAGNOSIS — R8290 Unspecified abnormal findings in urine: Secondary | ICD-10-CM

## 2022-07-18 DIAGNOSIS — E1122 Type 2 diabetes mellitus with diabetic chronic kidney disease: Secondary | ICD-10-CM

## 2022-07-18 DIAGNOSIS — N1832 Chronic kidney disease, stage 3b: Secondary | ICD-10-CM

## 2022-07-27 ENCOUNTER — Emergency Department
Admission: EM | Admit: 2022-07-27 | Discharge: 2022-07-27 | Disposition: A | Discharge status: Home / Self Care | Payer: Medicare Other | Attending: Emergency Medicine | Admitting: Emergency Medicine

## 2022-07-27 ENCOUNTER — Emergency Department: Payer: Medicare Other

## 2022-07-27 ENCOUNTER — Other Ambulatory Visit: Payer: Self-pay

## 2022-07-27 DIAGNOSIS — R600 Localized edema: Secondary | ICD-10-CM | POA: Diagnosis not present

## 2022-07-27 DIAGNOSIS — M7989 Other specified soft tissue disorders: Secondary | ICD-10-CM | POA: Diagnosis present

## 2022-07-27 DIAGNOSIS — E119 Type 2 diabetes mellitus without complications: Secondary | ICD-10-CM | POA: Diagnosis not present

## 2022-07-27 MED ORDER — FUROSEMIDE 20 MG PO TABS: 20.0000 mg | ORAL_TABLET | Freq: Every day | ORAL | 1 refills | Status: AC

## 2022-07-27 NOTE — ED Triage Notes (Signed)
Pt states she has been having some left foot/leg swelling for the past 3-4 days. Recent medication change as well.

## 2022-07-27 NOTE — ED Provider Notes (Signed)
   [facility] Provider Note    Event Date/Time   First MD Initiated Contact with Patient 07/27/22 1254     (approximate)   History   Leg Swelling   HPI  [place] is a 72 y.o. female with a history of diabetes, right below the knee amputation who presents with complaints of swelling in her left leg which is mild.  She also reports some swelling in her right leg.  Recently started on fluid pill reportedly.  No shortness of breath.     Physical Exam   Triage Vital Signs: ED Triage Vitals  Enc Vitals Group     BP 07/27/22 1245 (!) 170/54     Pulse Rate 07/27/22 1245 91     Resp 07/27/22 1245 18     Temp 07/27/22 1245 98.1 F (36.7 C)     Temp Source 07/27/22 1245 Oral     SpO2 07/27/22 1245 98 %     Weight 07/27/22 1246 88.9 kg (196 lb)     Height --      Head Circumference --      Peak Flow --      Pain Score 07/27/22 1246 7     Pain Loc --      Pain Edu? --      Excl. in [place]? --     Most recent vital signs: Vitals:   07/27/22 1245  BP: (!) 170/54  Pulse: 91  Resp: 18  Temp: 98.1 F (36.7 C)  SpO2: 98%     General: Awake, no distress.  CV:  Good peripheral perfusion.  Resp:  Normal effort.  Abd:  No distention.  Other:  Left leg: Warm and well-perfused distally, mild edema just above the level of the ankle, no wounds noted.  No redness.   ED Results / Procedures / Treatments   Labs (all labs ordered are listed, but only abnormal results are displayed) Labs Reviewed - No data to display   EKG     RADIOLOGY Ultrasound viewed interpret by me, no DVT, pending radiology review     PROCEDURES:  Critical Care performed:   Procedures   MEDICATIONS ORDERED IN ED: Medications - No data to display   IMPRESSION / MDM / [place] / ED COURSE  I reviewed the triage vital signs and the nursing notes. Patient's presentation is most consistent with acute presentation with potential threat to life or  bodily function.  Patient presents with lower extremity swelling as noted above, differential includes DVT, peripheral edema, infection.  Exam is overall reassuring, warm and well-perfused, minimal swelling.  Suspect peripheral edema, ultrasound is negative for DVT.  Reviewed patient's recent lab work, will start her on a low-dose of Lasix, she will follow-up with her PCP for reevaluation within 1 week.        FINAL CLINICAL IMPRESSION(S) / ED DIAGNOSES   Final diagnoses:  Leg edema     Rx / DC Orders   ED Discharge Orders          Ordered    furosemide (LASIX) 20 MG tablet  Daily        07/27/22 1425             Note:  This document was prepared using Dragon voice recognition software and may include unintentional dictation errors.   Lavonia Drafts, MD 07/27/22 1430

## 2022-11-12 ENCOUNTER — Other Ambulatory Visit (INDEPENDENT_AMBULATORY_CARE_PROVIDER_SITE_OTHER): Payer: Self-pay | Admitting: Podiatry

## 2022-11-12 DIAGNOSIS — I739 Peripheral vascular disease, unspecified: Secondary | ICD-10-CM

## 2022-12-12 ENCOUNTER — Other Ambulatory Visit: Payer: Self-pay | Admitting: Internal Medicine

## 2022-12-12 DIAGNOSIS — Z1231 Encounter for screening mammogram for malignant neoplasm of breast: Secondary | ICD-10-CM

## 2022-12-24 ENCOUNTER — Ambulatory Visit
Admission: EM | Admit: 2022-12-24 | Discharge: 2022-12-24 | Disposition: A | Discharge status: Home / Self Care | Payer: Medicare Other | Attending: Emergency Medicine | Admitting: Emergency Medicine

## 2022-12-24 ENCOUNTER — Ambulatory Visit (INDEPENDENT_AMBULATORY_CARE_PROVIDER_SITE_OTHER): Payer: Medicare Other

## 2022-12-24 DIAGNOSIS — R062 Wheezing: Secondary | ICD-10-CM | POA: Diagnosis present

## 2022-12-24 DIAGNOSIS — R059 Cough, unspecified: Secondary | ICD-10-CM

## 2022-12-24 DIAGNOSIS — J40 Bronchitis, not specified as acute or chronic: Secondary | ICD-10-CM | POA: Insufficient documentation

## 2022-12-24 DIAGNOSIS — Z1152 Encounter for screening for COVID-19: Secondary | ICD-10-CM | POA: Insufficient documentation

## 2022-12-24 LAB — SARS CORONAVIRUS 2 BY RT PCR: SARS Coronavirus 2 by RT PCR: NEGATIVE

## 2022-12-24 MED ORDER — BENZONATATE 100 MG PO CAPS: 200.0000 mg | ORAL_CAPSULE | Freq: Three times a day (TID) | ORAL | 0 refills | Status: AC

## 2022-12-24 MED ORDER — DOXYCYCLINE HYCLATE 100 MG PO CAPS: 100.0000 mg | ORAL_CAPSULE | Freq: Two times a day (BID) | ORAL | 0 refills | Status: AC

## 2022-12-24 MED ORDER — PROMETHAZINE-DM 6.25-15 MG/5ML PO SYRP: 5.0000 mL | ORAL_SOLUTION | Freq: Four times a day (QID) | ORAL | 0 refills | Status: AC | PRN

## 2022-12-24 MED ORDER — ALBUTEROL SULFATE HFA 108 (90 BASE) MCG/ACT IN AERS: 1.0000 | INHALATION_SPRAY | Freq: Four times a day (QID) | RESPIRATORY_TRACT | 0 refills | Status: AC | PRN

## 2022-12-24 NOTE — ED Provider Notes (Signed)
[facility]    CSN: [id number] Arrival date & time: 12/24/22  1049      History   Chief Complaint Chief Complaint  Patient presents with   Cough    HPI [name] is a 72 y.o. female.   HPI  72 year old female with a past medical history significant for type 2 diabetes, peripheral arterial disease, diabetic foot ulcer, hypertension, diabetic polyneuropathy, and hyperlipidemia presents for evaluation of 1 month worth of cough.  This has been associated with runny nose.  Patient has been attributing these to her allergies.  She went to a class reunion over the weekend and then she developed shortness of breath with some wheezing yesterday.  She reports that she has been able to get up some phlegm but now she is unable to.  She has not had a fever.  Past Medical History:  Diagnosis Date   Diabetes mellitus without complication (HCC)    Hypertension     Patient Active Problem List   Diagnosis Date Noted   Toe infection 03/09/2021   Right foot infection 03/08/2021   Type 2 diabetes mellitus with other specified complication (HCC) 03/08/2021   Peripheral arterial disease (HCC) 02/21/2021   Gangrene of toe of right foot (HCC)    Diabetic foot ulcers (HCC) 02/17/2021   AKI (acute kidney injury) (HCC) 02/17/2021   Diabetic foot ulcer (HCC) 02/17/2021   E coli bacteremia 03/19/2019   Diabetes mellitus without complication (HCC)    Hypertension associated with diabetes (HCC)    Diabetic polyneuropathy associated with type 2 diabetes mellitus (HCC) 03/01/2018   Hyperlipidemia associated with type 2 diabetes mellitus (HCC) 11/20/2013   Osteoporosis 11/20/2013    Past Surgical History:  Procedure Laterality Date   AMPUTATION TOE Right 03/09/2021   Procedure: AMPUTATION TOE-5th Toe;  Surgeon: Felecia Shelling, DPM;  Location: [facility] ORS;  Service: Podiatry;  Laterality: Right;   BACK SURGERY     LEG AMPUTATION BELOW KNEE Right 03/2021   LOWER EXTREMITY  ANGIOGRAPHY Right 02/21/2021   Procedure: Lower Extremity Angiography;  Surgeon: Annice Needy, MD;  Location: [facility] INVASIVE CV LAB;  Service: Cardiovascular;  Laterality: Right;   VAGINAL DELIVERY      OB History   No obstetric history on file.      Home Medications    Prior to Admission medications   Medication Sig Start Date End Date Taking? Authorizing Provider  atorvastatin (LIPITOR) 10 MG tablet Take 1 tablet (10 mg total) by mouth daily. 02/23/21  Yes Marrion Coy, MD  benzonatate (TESSALON) 100 MG capsule Take 2 capsules (200 mg total) by mouth every 8 (eight) hours. 12/24/22  Yes Becky Augusta, NP  clopidogrel (PLAVIX) 75 MG tablet Take 1 tablet (75 mg total) by mouth daily. 02/23/21  Yes Marrion Coy, MD  doxycycline (VIBRAMYCIN) 100 MG capsule Take 1 capsule (100 mg total) by mouth 2 (two) times daily for 7 days. 12/24/22 12/31/22 Yes Becky Augusta, NP  DULoxetine (CYMBALTA) 60 MG capsule Take 1 tablet by mouth daily. 06/09/22  Yes [provider]  fluconazole (DIFLUCAN) 100 MG tablet Take by mouth. 12/20/22 12/27/22 Yes [provider]  furosemide (LASIX) 20 MG tablet Take 1 tablet (20 mg total) by mouth daily. 07/27/22 07/27/23 Yes Jene Every, MD  gabapentin (NEURONTIN) 100 MG capsule Take 100 mg by mouth 3 (three) times daily.   Yes [provider]  glipiZIDE (GLUCOTROL XL) 5 MG 24 hr tablet Take 5 mg by mouth daily. 03/01/21  Yes [provider]  hydrALAZINE (APRESOLINE) 25 MG tablet Take 1 tablet (25 mg total) by mouth every 8 (eight) hours. 02/22/21  Yes Marrion Coy, MD  JARDIANCE 25 MG TABS tablet Take 25 mg by mouth daily.   Yes [provider]  losartan-hydrochlorothiazide (HYZAAR) 100-12.5 MG tablet Take 1 tablet by mouth daily.   Yes [provider]  nystatin cream (MYCOSTATIN) Apply topically 2 (two) times daily. 12/12/22 12/12/23 Yes [provider]  promethazine-dextromethorphan (PROMETHAZINE-DM) 6.25-15 MG/5ML  syrup Take 5 mLs by mouth 4 (four) times daily as needed. 12/24/22  Yes Becky Augusta, NP  albuterol (VENTOLIN HFA) 108 (90 Base) MCG/ACT inhaler Inhale 1-2 puffs into the lungs every 6 (six) hours as needed for wheezing or shortness of breath. 12/24/22   Becky Augusta, NP  fluticasone-salmeterol [facility] INHUB) 100-50 MCG/ACT AEPB Inhale 1 puff into the lungs 2 (two) times daily for 7 days. 11/13/21 11/20/21  Barbette Merino, NP    Family History Family History  Problem Relation Age of Onset   Liver disease Mother     Social History Social History   Tobacco Use   Smoking status: Never   Smokeless tobacco: Never  Vaping Use   Vaping status: Never Used  Substance Use Topics   Alcohol use: No   Drug use: Not Currently     Allergies   Patient has no known allergies.   Review of Systems Review of Systems  Constitutional:  Negative for fever.  HENT:  Positive for congestion and rhinorrhea.   Respiratory:  Positive for cough, shortness of breath and wheezing.      Physical Exam Triage Vital Signs ED Triage Vitals  Encounter Vitals Group     BP      Systolic BP Percentile      Diastolic BP Percentile      Pulse      Resp      Temp      Temp src      SpO2      Weight      Height      Head Circumference      Peak Flow      Pain Score      Pain Loc      Pain Education      Exclude from Growth Chart    No data found.  Updated Vital Signs BP (!) 152/70 (BP Location: Left Arm)   Pulse 73   Temp 98.5 F (36.9 C) (Oral)   Resp 17   Wt 192 lb (87.1 kg)   SpO2 97%   BMI 30.07 kg/m   Visual Acuity Right Eye Distance:   Left Eye Distance:   Bilateral Distance:    Right Eye Near:   Left Eye Near:    Bilateral Near:     Physical Exam Vitals and nursing note reviewed.  Constitutional:      Appearance: Normal appearance. She is not ill-appearing.  HENT:     Head: Normocephalic and atraumatic.     Nose: Congestion and rhinorrhea present.     Comments: Nasal  mucosa is erythematous and edematous with clear discharge in both nares.    Mouth/Throat:     Mouth: Mucous membranes are moist.     Pharynx: Oropharynx is clear. Posterior oropharyngeal erythema present. No oropharyngeal exudate.     Comments: Mild erythema to the posterior oropharynx with clear postnasal drip. Cardiovascular:     Rate and Rhythm: Normal rate and regular rhythm.  Pulses: Normal pulses.     Heart sounds: Normal heart sounds. No murmur heard.    No friction rub. No gallop.  Pulmonary:     Effort: Pulmonary effort is normal.     Breath sounds: Wheezing and rhonchi present. No rales.     Comments: Scattered wheezing and rhonchi in all lung fields. Skin:    General: Skin is warm and dry.  Neurological:     Mental Status: She is alert.      UC Treatments / Results  Labs (all labs ordered are listed, but only abnormal results are displayed) Labs Reviewed  SARS CORONAVIRUS 2 BY RT PCR    EKG   Radiology No results found.  Procedures Procedures (including critical care time)  Medications Ordered in UC Medications - No data to display  Initial Impression / Assessment and Plan / UC Course  I have reviewed the triage vital signs and the nursing notes.  Pertinent labs & imaging results that were available during my care of the patient were reviewed by me and considered in my medical decision making (see chart for details).   Patient is a pleasant, nontoxic-appearing 79 old female presenting for evaluation of 1 month worth of respiratory complaints as outlined HPI above.  She is able to speak in full sentence without dyspnea or tachypnea.  In triage her respiratory rate is 17 with a 97% room air oxygen saturation.  Afebrile 98.5 orally.  She has been experiencing runny nose which she attributes to allergies and a productive cough for the past month.  Over the last days she has become short of breath.  She went to a class reunion this past weekend and reports that  she did not wear a mask.  On exam she does have erythematous and edematous nasal mucosa with clear rhinorrhea as well as clear postnasal drip in the posterior oropharynx.  Cardiopulmonary exam reveals wheezes and rhonchi diffusely.  Given her recent worsening of symptoms and unknown COVID exposure I will order a COVID PCR.  I will also order a chest x-ray to evaluate for any acute cardiopulmonary pathology.  This x-ray independently reviewed and evaluated by me.  Impression: No evidence of infiltrate or effusion.  Cardiomediastinal silhouette appears normal.  Radiology overread is pending.  COVID PCR is negative.  I will discharge patient home with a diagnosis of bronchitis and start her on doxycycline 100 mg twice daily for symptoms for treatment of bronchitis along with Tessalon Perles and Promethazine DM cough syrup to up with cough and congestion.  Additionally, I will refill her albuterol inhaler and she can do 1 to 2 puffs every 4-6 hours as needed for shortness of breath or wheezing.  Final Clinical Impressions(s) / UC Diagnoses   Final diagnoses:  Bronchitis     Discharge Instructions      Use the albuterol inhaler/nebulizer every 4-6 hours as needed for shortness of breath, wheezing, and cough.  Take the doxycycline 100 mg twice daily with food for treatment of your bronchitis.  Use the Tessalon Perles every 8 hours for your cough.  Taken with a small sip of water.  They may give you some numbness to the base of your tongue or metallic taste in your mouth, this is normal.  They are designed to calm down the cough reflex.  Use the Promethazine DM cough syrup at bedtime as will make you drowsy.  You may take 1 teaspoon (5 mL) every 6 hours.  Return for reevaluation for new or  worsening symptoms.      ED Prescriptions     Medication Sig Dispense Auth. Provider   albuterol (VENTOLIN HFA) 108 (90 Base) MCG/ACT inhaler Inhale 1-2 puffs into the lungs every 6 (six) hours as needed  for wheezing or shortness of breath. 1 g Becky Augusta, NP   benzonatate (TESSALON) 100 MG capsule Take 2 capsules (200 mg total) by mouth every 8 (eight) hours. 21 capsule Becky Augusta, NP   doxycycline (VIBRAMYCIN) 100 MG capsule Take 1 capsule (100 mg total) by mouth 2 (two) times daily for 7 days. 14 capsule Becky Augusta, NP   promethazine-dextromethorphan (PROMETHAZINE-DM) 6.25-15 MG/5ML syrup Take 5 mLs by mouth 4 (four) times daily as needed. 118 mL Becky Augusta, NP      PDMP not reviewed this encounter.   Becky Augusta, NP 12/24/22 1213

## 2022-12-24 NOTE — ED Triage Notes (Signed)
Cough for a month. Went to class reunion Friday with out mask.

## 2022-12-24 NOTE — Discharge Instructions (Addendum)
Use the albuterol inhaler/nebulizer every 4-6 hours as needed for shortness of breath, wheezing, and cough.  Take the doxycycline 100 mg twice daily with food for treatment of your bronchitis.  Use the Tessalon Perles every 8 hours for your cough.  Taken with a small sip of water.  They may give you some numbness to the base of your tongue or metallic taste in your mouth, this is normal.  They are designed to calm down the cough reflex.  Use the Promethazine DM cough syrup at bedtime as will make you drowsy.  You may take 1 teaspoon (5 mL) every 6 hours.  Return for reevaluation for new or worsening symptoms.

## 2023-01-01 ENCOUNTER — Telehealth (INDEPENDENT_AMBULATORY_CARE_PROVIDER_SITE_OTHER): Payer: Self-pay

## 2023-01-01 NOTE — Telephone Encounter (Signed)
Patient called about the appointment notification she received today. She stated she hasn't made an appt with [place] and wondering what it was for. After looking into her chart, I let her know that her appt was scheduled by Dr. Excell Seltzer for an ABI on 12/19/22 for 01/03/23. Patient decided she wanted to cancel the appt because, she didn't know about it and doesn't drive so, she have a way of getting here. Patient will call when she has a ride arranged with her daughter or niece.

## 2023-01-03 ENCOUNTER — Encounter: Payer: Self-pay | Admitting: Emergency Medicine

## 2023-01-03 ENCOUNTER — Ambulatory Visit
Admission: EM | Admit: 2023-01-03 | Discharge: 2023-01-03 | Disposition: A | Discharge status: Home / Self Care | Payer: Medicare Other | Source: Home / Self Care

## 2023-01-03 ENCOUNTER — Encounter (INDEPENDENT_AMBULATORY_CARE_PROVIDER_SITE_OTHER): Payer: Medicare Other

## 2023-01-03 DIAGNOSIS — B372 Candidiasis of skin and nail: Secondary | ICD-10-CM

## 2023-01-03 DIAGNOSIS — S30811A Abrasion of abdominal wall, initial encounter: Secondary | ICD-10-CM | POA: Diagnosis not present

## 2023-01-03 DIAGNOSIS — Z5189 Encounter for other specified aftercare: Secondary | ICD-10-CM | POA: Diagnosis not present

## 2023-01-03 MED ORDER — BACITRACIN ZINC 500 UNIT/GM EX OINT
1.0000 | TOPICAL_OINTMENT | Freq: Two times a day (BID) | CUTANEOUS | 0 refills | Status: AC
Start: 2023-01-03 — End: ?

## 2023-01-03 MED ORDER — NYSTATIN 100000 UNIT/GM EX POWD
1.0000 | Freq: Three times a day (TID) | CUTANEOUS | 0 refills | Status: AC
Start: 2023-01-03 — End: ?

## 2023-01-03 NOTE — Discharge Instructions (Addendum)
Apply ointment to open wound area and then apply nystatin powder to abdomen area.  Keep area dry by placing a cotton cloth in the fold of the skin. Follow up PCP or in urgent care of wounds worsening or drainage develops.

## 2023-01-03 NOTE — ED Triage Notes (Signed)
Patient was referred to Vascular by her PCP.  Patient's appointment with Vascular was scheduled for today.  Patient called and canceled her Vascular appointment on 01/01/23.  Patient is here at [facility], if states that she has a wound around her waistline for the past 3 weeks.  Patient denies any drainage from the site.  Patient denies fevers.

## 2023-01-03 NOTE — ED Provider Notes (Signed)
[facility]    CSN: [id number] Arrival date & time: 01/03/23  1813      History   Chief Complaint Chief Complaint  Patient presents with  . Wound Check    HPI Ashley Ellison is a 72 y.o. female.  Presenting for wound check of lower pannus area.  Area is excoriated.  She has been cleaning area with peroxide.  But no other medications applied.  The history is provided by the patient.  Wound Check This is a recurrent problem. The current episode started more than 1 week ago. Treatments tried: Peroxide for washing. The treatment provided no relief.    Past Medical History:  Diagnosis Date  . Diabetes mellitus without complication (HCC)   . Hypertension     Patient Active Problem List   Diagnosis Date Noted  . Toe infection 03/09/2021  . Right foot infection 03/08/2021  . Type 2 diabetes mellitus with other specified complication (HCC) 03/08/2021  . Peripheral arterial disease (HCC) 02/21/2021  . Gangrene of toe of right foot (HCC)   . Diabetic foot ulcers (HCC) 02/17/2021  . AKI (acute kidney injury) (HCC) 02/17/2021  . Diabetic foot ulcer (HCC) 02/17/2021  . E coli bacteremia 03/19/2019  . Diabetes mellitus without complication (HCC)   . Hypertension associated with diabetes (HCC)   . Diabetic polyneuropathy associated with type 2 diabetes mellitus (HCC) 03/01/2018  . Hyperlipidemia associated with type 2 diabetes mellitus (HCC) 11/20/2013  . Osteoporosis 11/20/2013    Past Surgical History:  Procedure Laterality Date  . AMPUTATION TOE Right 03/09/2021   Procedure: AMPUTATION TOE-5th Toe;  Surgeon: Felecia Shelling, DPM;  Location: [facility] ORS;  Service: Podiatry;  Laterality: Right;  . BACK SURGERY    . 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 LOWER EXTREMITY ANGIOGRAPHY Right 02/21/2021   Procedure: Lower Extremity Angiography;  Surgeon: Annice Needy, MD;  Location: [facility] INVASIVE CV LAB;  Service: Cardiovascular;  Laterality: Right;  Marland Kitchen VAGINAL  DELIVERY      OB History   No obstetric history on file.      Home Medications    Prior to Admission medications   Medication Sig Start Date End Date Taking? Authorizing Provider  bacitracin ointment Apply 1 Application topically 2 (two) times daily. 01/03/23  Yes Carollee Nussbaumer, Linde Gillis, NP  nystatin (MYCOSTATIN/NYSTOP) powder Apply 1 Application topically 3 (three) times daily. 01/03/23  Yes Laurelyn Terrero, Linde Gillis, NP  albuterol (VENTOLIN HFA) 108 (90 Base) MCG/ACT inhaler Inhale 1-2 puffs into the lungs every 6 (six) hours as needed for wheezing or shortness of breath. 12/24/22   Becky Augusta, NP  atorvastatin (LIPITOR) 10 MG tablet Take 1 tablet (10 mg total) by mouth daily. 02/23/21   Marrion Coy, MD  benzonatate (TESSALON) 100 MG capsule Take 2 capsules (200 mg total) by mouth every 8 (eight) hours. 12/24/22   Becky Augusta, NP  clopidogrel (PLAVIX) 75 MG tablet Take 1 tablet (75 mg total) by mouth daily. 02/23/21   Marrion Coy, MD  DULoxetine (CYMBALTA) 60 MG capsule Take 1 tablet by mouth daily. 06/09/22   [provider]  fluticasone-salmeterol (WIXELA INHUB) 100-50 MCG/ACT AEPB Inhale 1 puff into the lungs 2 (two) times daily for 7 days. 11/13/21 11/20/21  Barbette Merino, NP  furosemide (LASIX) 20 MG tablet Take 1 tablet (20 mg total) by mouth daily. 07/27/22 07/27/23  Jene Every, MD  gabapentin (NEURONTIN) 100 MG capsule Take 100 mg by mouth 3 (three) times daily.  [provider]  glipiZIDE (GLUCOTROL XL) 5 MG 24 hr tablet Take 5 mg by mouth daily. 03/01/21   [provider]  hydrALAZINE (APRESOLINE) 25 MG tablet Take 1 tablet (25 mg total) by mouth every 8 (eight) hours. 02/22/21   Marrion Coy, MD  JARDIANCE 25 MG TABS tablet Take 25 mg by mouth daily.    [provider]  losartan-hydrochlorothiazide (HYZAAR) 100-12.5 MG tablet Take 1 tablet by mouth daily.    [provider]  nystatin cream (MYCOSTATIN) Apply topically 2 (two) times daily.  12/12/22 12/12/23  [provider]  promethazine-dextromethorphan (PROMETHAZINE-DM) 6.25-15 MG/5ML syrup Take 5 mLs by mouth 4 (four) times daily as needed. 12/24/22   Becky Augusta, NP    Family History Family History  Problem Relation Age of Onset  . Liver disease Mother     Social History Social History   Tobacco Use  . Smoking status: Never  . Smokeless tobacco: Never  Vaping Use  . Vaping status: Never Used  Substance Use Topics  . Alcohol use: No  . Drug use: Not Currently     Allergies   Povidone-iodine   Review of Systems Review of Systems  Skin:  Positive for wound.     Physical Exam Triage Vital Signs ED Triage Vitals  Encounter Vitals Group     BP 01/03/23 1826 (!) 156/65     Systolic BP Percentile --      Diastolic BP Percentile --      Pulse Rate 01/03/23 1826 81     Resp 01/03/23 1826 15     Temp 01/03/23 1826 98.4 F (36.9 C)     Temp Source 01/03/23 1826 Oral     SpO2 01/03/23 1826 100 %     Weight 01/03/23 1824 192 lb 0.3 oz (87.1 kg)     Height 01/03/23 1824 5\' 7"  (1.702 m)     Head Circumference --      Peak Flow --      Pain Score 01/03/23 1824 0     Pain Loc --      Pain Education --      Exclude from Growth Chart --    No data found.  Updated Vital Signs BP (!) 156/65 (BP Location: Left Arm)   Pulse 81   Temp 98.4 F (36.9 C) (Oral)   Resp 15   Ht 5\' 7"  (1.702 m)   Wt 192 lb 0.3 oz (87.1 kg)   SpO2 100%   BMI 30.07 kg/m   Visual Acuity Right Eye Distance:   Left Eye Distance:   Bilateral Distance:    Right Eye Near:   Left Eye Near:    Bilateral Near:     Physical Exam Skin:    Findings: Lesion present.     Comments: Multiple areas of excoriation with various depths, wound beds are pale.  Surrounding fold of pannus are dark in nature probable yeast involvement.     UC Treatments / Results  Labs (all labs ordered are listed, but only abnormal results are displayed) Labs Reviewed - No data to  display  EKG   Radiology No results found.  Procedures Procedures (including critical care time)  Medications Ordered in UC Medications - No data to display  Initial Impression / Assessment and Plan / UC Course  I have reviewed the triage vital signs and the nursing notes.  Pertinent labs & imaging results that were available during my care of the patient were reviewed by me  and considered in my medical decision making (see chart for details).   Wounds of lower pannus suprapubic area are consistent with excoriation and yeast involvement.  Patient has been cleaning area with hydrogen peroxide.  No other ointment used.  Wound is cleaned in office and a gauze dressing is applied. Final Clinical Impressions(s) / UC Diagnoses   Final diagnoses:  Skin yeast infection  Excoriation of abdomen, initial encounter  Visit for wound check     Discharge Instructions      Apply ointment to open wound area and then apply nystatin powder to abdomen area.  Keep area dry by placing a cotton cloth in the fold of the skin. Follow up PCP or in urgent care of wounds worsening or drainage develops.      ED Prescriptions     Medication Sig Dispense Auth. Provider   nystatin (MYCOSTATIN/NYSTOP) powder Apply 1 Application topically 3 (three) times daily. 15 g Anthem Frazer M, NP   bacitracin ointment Apply 1 Application topically 2 (two) times daily. 120 g Myrka Sylva, Linde Gillis, NP      PDMP not reviewed this encounter.   Nelda Marseille, NP 01/03/23 2012

## 2023-01-09 ENCOUNTER — Telehealth: Payer: Self-pay

## 2023-01-09 MED ORDER — MUPIROCIN 2 % EX OINT: TOPICAL_OINTMENT | Freq: Two times a day (BID) | CUTANEOUS | 0 refills | Status: AC

## 2023-01-09 NOTE — Telephone Encounter (Signed)
Pt called reporting difficulty filling Rx for bacitracin. RN called pharmacy and was told d/t it being available OTC, it was not covered by insurance. Relayed to pt who asked for alternative medication. Orders from Helaine Chess, PA-C for mupirocin.

## 2023-06-10 ENCOUNTER — Other Ambulatory Visit (INDEPENDENT_AMBULATORY_CARE_PROVIDER_SITE_OTHER): Payer: Self-pay | Admitting: Podiatry

## 2023-06-10 DIAGNOSIS — I739 Peripheral vascular disease, unspecified: Secondary | ICD-10-CM

## 2023-07-09 ENCOUNTER — Telehealth (INDEPENDENT_AMBULATORY_CARE_PROVIDER_SITE_OTHER): Payer: Self-pay | Admitting: Internal Medicine

## 2023-07-09 NOTE — Telephone Encounter (Signed)
 Called pt to schedule pt states will call back later due to pt being in therapy at that moment made sure pt had our correct call back number   LO np. ABI. consult if abnormal. baker, andrew.

## 2023-08-13 IMAGING — CR DG ELBOW COMPLETE 3+V*R*
4 series · 4 of 4 positions shown · non-contrast
Comparison: None.

CLINICAL DATA: Right elbow pain after fall.

EXAM:
RIGHT ELBOW - COMPLETE 3+ VIEW

[elbow ap]
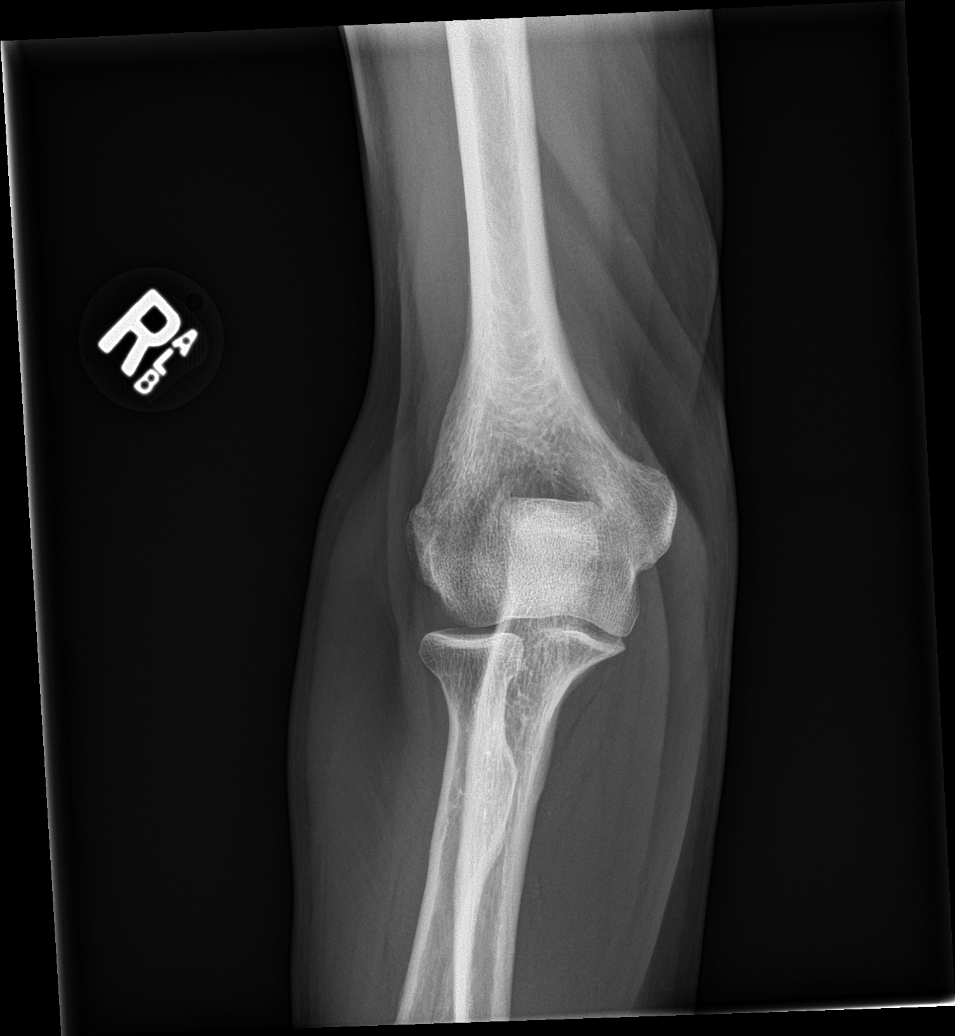

[elbow obl (1 of 2)]
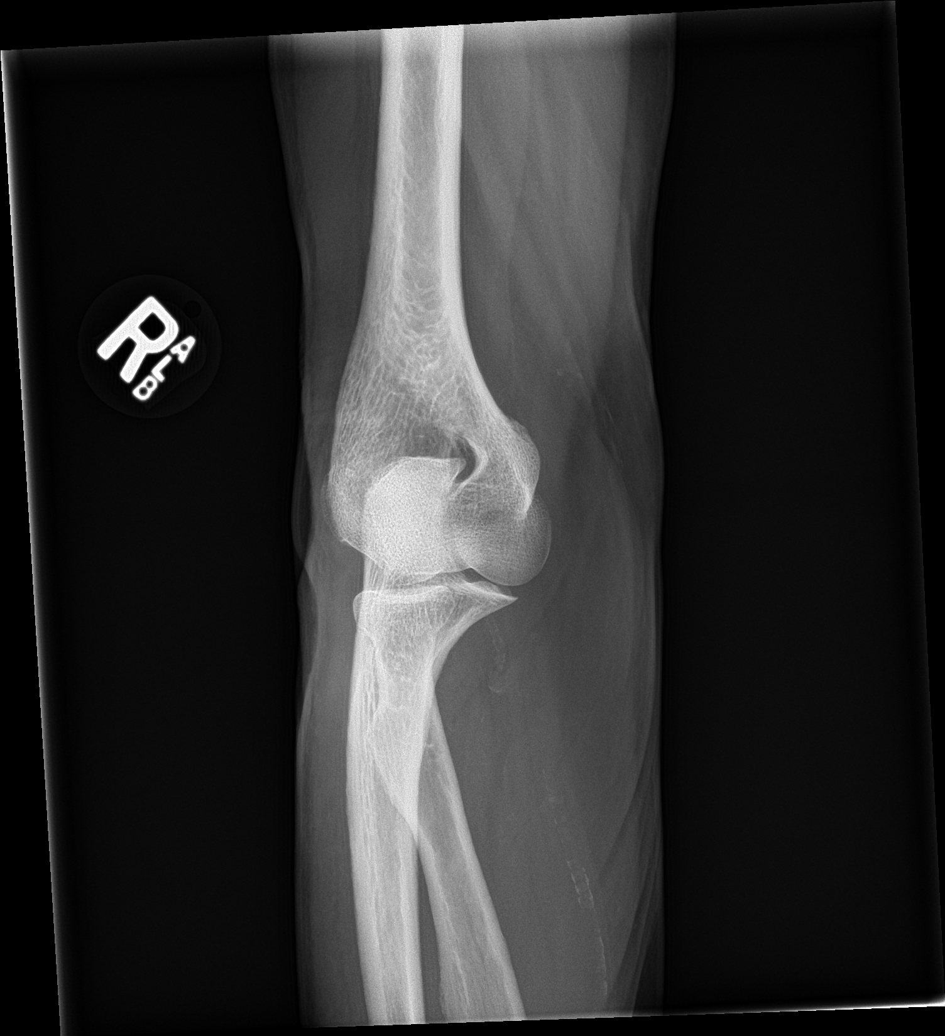

[elbow obl (2 of 2)]
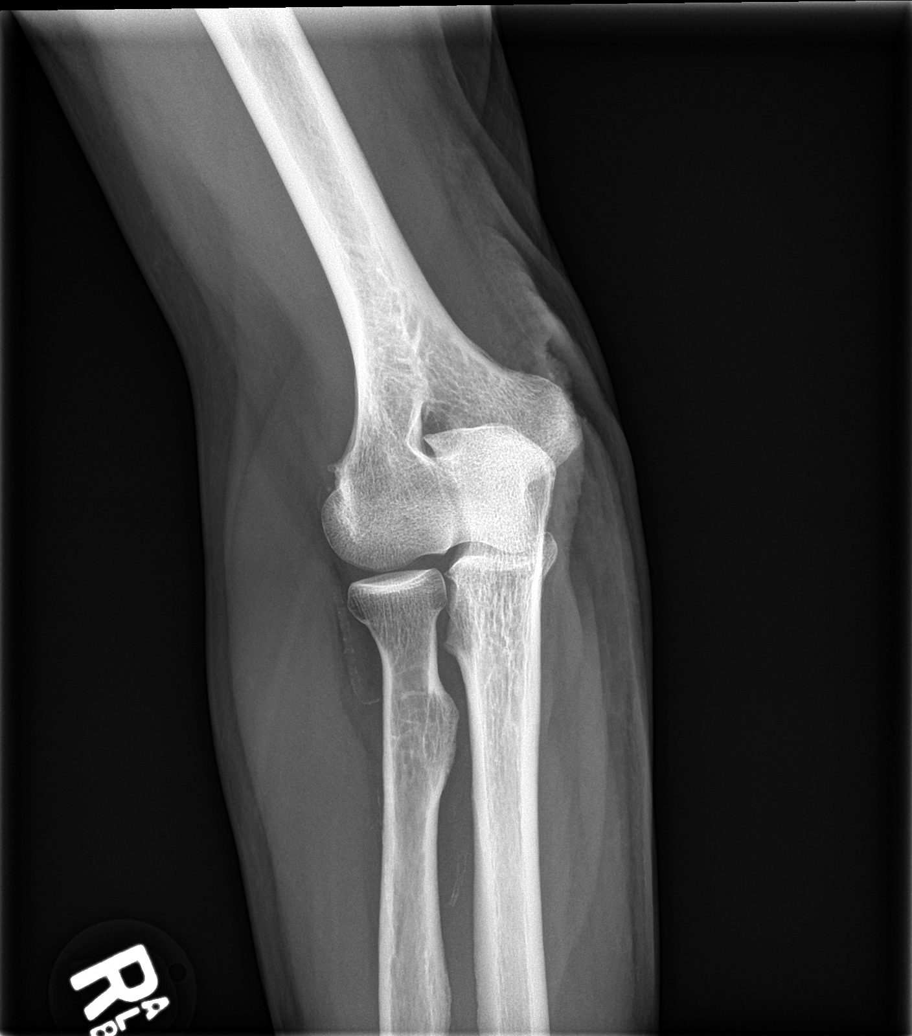

[elbow lat]
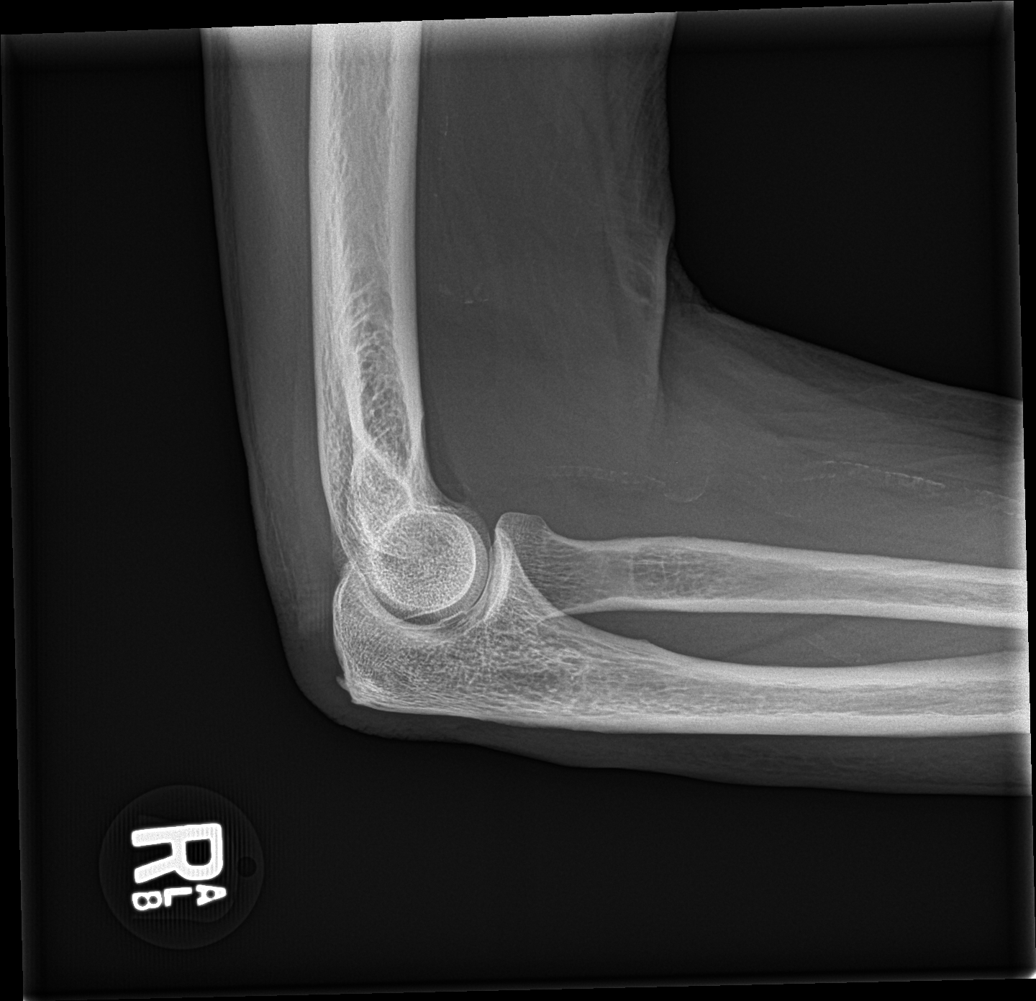

[4 of 4 positions shown; findings below may reference images not displayed]

FINDINGS: There is no evidence of fracture, dislocation, or joint effusion.
There is no evidence of arthropathy or other focal bone abnormality.
Soft tissues are unremarkable.
IMPRESSION: Negative.

## 2023-08-14 ENCOUNTER — Encounter (INDEPENDENT_AMBULATORY_CARE_PROVIDER_SITE_OTHER): Payer: Self-pay | Admitting: Nurse Practitioner

## 2023-09-23 ENCOUNTER — Encounter (INDEPENDENT_AMBULATORY_CARE_PROVIDER_SITE_OTHER): Payer: Self-pay
